# Patient Record
Sex: Male | Born: 1935 | Race: White | Hispanic: No | Smoking: Never smoker
Health system: Southern US, Community
[De-identification: ages and names within clinical notes are randomized; demographics above are authoritative.]

## PROBLEM LIST (undated history)

## (undated) DIAGNOSIS — M75102 Unspecified rotator cuff tear or rupture of left shoulder, not specified as traumatic: Secondary | ICD-10-CM

## (undated) DIAGNOSIS — K219 Gastro-esophageal reflux disease without esophagitis: Secondary | ICD-10-CM

## (undated) DIAGNOSIS — C439 Malignant melanoma of skin, unspecified: Secondary | ICD-10-CM

## (undated) DIAGNOSIS — F419 Anxiety disorder, unspecified: Secondary | ICD-10-CM

## (undated) DIAGNOSIS — C61 Malignant neoplasm of prostate: Secondary | ICD-10-CM

## (undated) DIAGNOSIS — G4700 Insomnia, unspecified: Secondary | ICD-10-CM

## (undated) DIAGNOSIS — F0390 Unspecified dementia without behavioral disturbance: Secondary | ICD-10-CM

## (undated) HISTORY — PX: KNEE SURGERY: SHX244

## (undated) HISTORY — PX: EYE SURGERY: SHX253

## (undated) HISTORY — PX: HEMORRHOID SURGERY: SHX153

## (undated) HISTORY — PX: PROSTATE SURGERY: SHX751

## (undated) HISTORY — PX: TONSILLECTOMY: SUR1361

---

## 2003-09-22 ENCOUNTER — Observation Stay (HOSPITAL_COMMUNITY): Admission: RE | Admit: 2003-09-22 | Discharge: 2003-09-24 | Payer: Self-pay | Admitting: General Surgery

## 2003-09-22 ENCOUNTER — Encounter (INDEPENDENT_AMBULATORY_CARE_PROVIDER_SITE_OTHER): Payer: Self-pay | Admitting: Specialist

## 2007-06-24 ENCOUNTER — Ambulatory Visit: Admission: RE | Admit: 2007-06-24 | Discharge: 2007-07-21 | Payer: Self-pay | Admitting: Radiation Oncology

## 2007-07-06 ENCOUNTER — Ambulatory Visit (HOSPITAL_BASED_OUTPATIENT_CLINIC_OR_DEPARTMENT_OTHER): Admission: RE | Admit: 2007-07-06 | Discharge: 2007-07-06 | Payer: Self-pay | Admitting: Urology

## 2007-11-19 ENCOUNTER — Ambulatory Visit (HOSPITAL_COMMUNITY): Admission: RE | Admit: 2007-11-19 | Discharge: 2007-11-19 | Payer: Self-pay | Admitting: Urology

## 2008-04-15 ENCOUNTER — Ambulatory Visit: Payer: Self-pay | Admitting: Internal Medicine

## 2008-04-15 DIAGNOSIS — C61 Malignant neoplasm of prostate: Secondary | ICD-10-CM | POA: Insufficient documentation

## 2008-04-15 DIAGNOSIS — E162 Hypoglycemia, unspecified: Secondary | ICD-10-CM | POA: Insufficient documentation

## 2008-04-15 DIAGNOSIS — F411 Generalized anxiety disorder: Secondary | ICD-10-CM | POA: Insufficient documentation

## 2008-04-15 DIAGNOSIS — R058 Other specified cough: Secondary | ICD-10-CM | POA: Insufficient documentation

## 2008-04-15 DIAGNOSIS — Z85820 Personal history of malignant melanoma of skin: Secondary | ICD-10-CM | POA: Insufficient documentation

## 2008-04-15 DIAGNOSIS — G4700 Insomnia, unspecified: Secondary | ICD-10-CM | POA: Insufficient documentation

## 2008-04-15 DIAGNOSIS — Z87898 Personal history of other specified conditions: Secondary | ICD-10-CM | POA: Insufficient documentation

## 2008-04-15 DIAGNOSIS — R079 Chest pain, unspecified: Secondary | ICD-10-CM | POA: Insufficient documentation

## 2008-04-15 DIAGNOSIS — I498 Other specified cardiac arrhythmias: Secondary | ICD-10-CM | POA: Insufficient documentation

## 2008-04-15 DIAGNOSIS — K219 Gastro-esophageal reflux disease without esophagitis: Secondary | ICD-10-CM | POA: Insufficient documentation

## 2008-04-15 DIAGNOSIS — R05 Cough: Secondary | ICD-10-CM | POA: Insufficient documentation

## 2010-09-18 NOTE — Op Note (Signed)
NAMEABOU, STERKEL            ACCOUNT NO.:  [id number]   MEDICAL RECORD NO.:  [id number]          PATIENT TYPE:  AMB   LOCATION:  [facility]                         FACILITY:  [facility]   PHYSICIAN:  Courtney Paris, M.D.DATE OF BIRTH:  [birth year]   DATE OF PROCEDURE:  07/06/2007  DATE OF DISCHARGE:                               OPERATIVE REPORT   PREOPERATIVE DIAGNOSIS:  Carcinoma of the prostate stage IIA, Gleason  3+3 with obstructive uropathy.   POSTOPERATIVE DIAGNOSIS:  Carcinoma of the prostate stage IIA, Gleason  3+3 with obstructive uropathy.   OPERATION:  TUNA.   ANESTHESIA:  General.   SURGEON:  Courtney Paris, M.D.   BRIEF HISTORY:  The 74 year old patient is admitted with clinical T2A  Gleason 3+3 adenocarcinoma of the prostate with elevated PSA. Biopsy  showed the cancer, but his symptom score was too high to undergo  radiation therapy at this time.  He went in retention in 2003 and he  cannot tolerate Avodart because of breast enlargement that required a  mammogram and he enters now for a TUNA procedure under anesthesia in  hopes that this would reduce his symptom score enough that he can  undergo definitive radiotherapy treatment to his prostate.  When he had  the original biopsy, his rectum was extremely tender and sore from  previous hemorrhoidectomy and it was thought that this was not possible  to be done under local anesthesia with a prostate block in the office so  general anesthesia was indicated.   The patient was placed on the operating table in dorsal lithotomy  position and after satisfactory induction of general anesthesia was  prepped and draped in the usual sterile fashion.  Given IV antibiotics.  Using the panendoscope, the prostate was inspected and measured about 65  grams. On preoperative films his length was about 5-1/2 cm and he had  trilobar hyperplasia.  The treatment was begun at the bladder neck and  with 14 mm needle extension at  6 o'clock this area was treated. The  right and left bladder neck were then treated with 20 mm needle  extension and then he had a total of 6 transverse treatments, each with  22 mm going back to a centimeter at a time and then the apex also at 22  mm for a total of 11 sticks.  The patient tolerated this well. A  #16  Foley catheter was inserted and the left to straight drainage.  The  irrigant was just light pink.  The patient tolerated this well and  returned home with detailed written instructions and will remove his  catheter in 2 days.      Courtney Paris, M.D.  Electronically Signed     HMK/MEDQ  D:  07/06/2007  T:  07/06/2007  Job:  [id number]

## 2010-09-21 NOTE — Op Note (Signed)
Jack Howard, Jack Howard                      ACCOUNT NO.:  [id number]   MEDICAL RECORD NO.:  [id number]                   PATIENT TYPE:  AMB   LOCATION:  DAY                                  [facility]ACILITY:  [facility]   PHYSICIAN:  Adolph Pollack, M.D.            DATE OF BIRTH:  10/24/1935   DATE OF PROCEDURE:  09/22/2003  DATE OF DISCHARGE:                                 OPERATIVE REPORT   PREOPERATIVE DIAGNOSES:  1. Hemorrhoids.  2. Anal rash.   POSTOPERATIVE DIAGNOSES:  1. Hemorrhoids.  2. Anal rash.   PROCEDURES:  1. Hemorrhoidectomy.  2. Biopsy of anal rash.   SURGEON:  Adolph Pollack, M.D.   ANESTHESIA:  General.   INDICATION:  Jack Howard is a 75 year old male, who has been bothered  by hemorrhoids intermittently for many years.  He has some bleeding after  bowel movements.  He has tried multiple treatments without success.  On  examination, indeed he has hemorrhoids in the right anterolateral and left  lateral position as well as a perianal rash.  He now presents for the above  procedures.  We discussed the biopsy of the perianal rash as well as the  hemorrhoidectomy preop.  We also discussed his BPH symptoms and staying  overnight potentially with a Foley catheter in.   TECHNIQUE:  He is seen in the holding area, then brought to the operating  room and placed supine on the operating table; general anesthetic was  administered.  He is placed in the lithotomy position, and the perianal area  was sterilely prepped and draped.  The hemorrhoidal complex in the right  anterolateral aspect was the first approach.  A mixture of Marcaine and  Wydase was injected subdermally and subcutaneously.  I then ligated the  hemorrhoidal vascular pedicle with a 3-0 chromic suture.  Using sharp  dissection, I excised the external and internal components of the  hemorrhoid.  Bleeding points were controlled by cautery.  I the approximated  the right knee mucosa and an anoderm  with a running locking 3-0 chromic  suture.  There was some bleeding along the suture line, and I reinforced  this with a single figure-of-eight 3-0 chromic suture for adequate  hemostasis.   Next, the left lateral hemorrhoidal complex was approached.  A mixture of  Marcaine and Wydase was injected into the subdermal and subcutaneous  tissues.  I then ligated a hemorrhoidal vascular pedicle with 3-0 chromic  suture and then excised the internal and external component sharply.  Care  was taken to preserve external sphincter muscle.  This was done also with  the right anterolateral hemorrhoidal complex.  Once the hemorrhoid was  removed, bleeding was controlled with the cautery and the anoderm and anal  mucosa of the skin were closed with running locking 3-0 chromic suture.  Hemostasis was adequate at the suture line.   Following this, I identified the rash and performed a punch  biopsy of part  of the rash and sent this to pathology separately.  Bleeding from this was  controlled with direct pressure.  I then placed some Gelfoam into the rectal  area.  Hemostasis was adequate.  A bulky dressing was applied.   Following this, I changed gloves and inserted a Foley catheter sterilely  without problems.   He tolerated the procedure well without any apparent complications.  He was  taken to the recovery room in satisfactory condition.                                               Adolph Pollack, M.D.    Kari Baars  D:  09/22/2003  T:  09/22/2003  Job:  [id number]   cc:   Meredith Staggers, M.D.  [address]  [place]  [place] [address]  Fax: [phone]

## 2011-01-06 ENCOUNTER — Encounter: Payer: Self-pay | Admitting: Family Medicine

## 2011-01-06 ENCOUNTER — Inpatient Hospital Stay (INDEPENDENT_AMBULATORY_CARE_PROVIDER_SITE_OTHER)
Admission: RE | Admit: 2011-01-06 | Discharge: 2011-01-06 | Disposition: A | Discharge status: Home / Self Care | Payer: Medicare Other | Source: Ambulatory Visit | Attending: Family Medicine | Admitting: Family Medicine

## 2011-01-06 DIAGNOSIS — H6690 Otitis media, unspecified, unspecified ear: Secondary | ICD-10-CM

## 2011-01-06 DIAGNOSIS — H9120 Sudden idiopathic hearing loss, unspecified ear: Secondary | ICD-10-CM | POA: Insufficient documentation

## 2011-01-28 LAB — DIFFERENTIAL
Eosinophils Absolute: 0
Eosinophils Relative: 0
Monocytes Absolute: 0.7
Monocytes Relative: 8
Neutro Abs: 5.8

## 2011-01-28 LAB — URINALYSIS, ROUTINE W REFLEX MICROSCOPIC
Ketones, ur: NEGATIVE
Leukocytes, UA: NEGATIVE
Nitrite: NEGATIVE
Urobilinogen, UA: 0.2

## 2011-01-28 LAB — CBC
MCV: 90.5
RBC: 4.22
WBC: 8

## 2011-01-28 LAB — BASIC METABOLIC PANEL
BUN: 21
Calcium: 9.1
Creatinine, Ser: 1.18
GFR calc non Af Amer: >60
Glucose, Bld: 110 — ABNORMAL HIGH
Sodium: 142

## 2011-04-08 NOTE — Progress Notes (Signed)
Summary: SUDDEN LOSS OF HEARING IN LEFT EAR (room 4)   Vital Signs:  Patient Profile:   75 Years Old Male CC:      sudden loss hearing left ear today Height:     68 inches Weight:      168 pounds O2 Sat:      97 % O2 treatment:    Room Air Temp:     98.6 degrees F oral Pulse rate:   62 / minute Resp:     16 per minute BP sitting:   120 / 71  (left arm) Cuff size:   regular  Pt. in pain?   no  Vitals Entered By: Lavell Islam RN (January 06, 2011 3:35 PM)                   Current Allergies: No known allergies History of Present Illness Chief Complaint: sudden loss hearing left ear today History of Present Illness:  Subjective:  Patient complains of awakening today and noticing a "whistling" noise in his left ear, followed by loss of hearing in the left ear.  No associated pain.  Denies nasal congestion, seasonal allergies, recent URI.  No neurologic symptoms.  No dizziness.  No drainage from ear.  No fevers, chills, and sweats.  Feels well otherwise.  REVIEW OF SYSTEMS Constitutional Symptoms      Denies fever, chills, night sweats, weight loss, weight gain, and fatigue.  Eyes       Denies change in vision, eye pain, eye discharge, glasses, contact lenses, and eye surgery. Ear/Nose/Throat/Mouth       Complains of change in hearing.      Denies hearing loss/aids, ear pain, ear discharge, dizziness, frequent runny nose, frequent nose bleeds, sinus problems, sore throat, hoarseness, and tooth pain or bleeding.      Comments: left ear Respiratory       Denies dry cough, productive cough, wheezing, shortness of breath, asthma, bronchitis, and emphysema/COPD.  Cardiovascular       Denies murmurs, chest pain, and tires easily with exhertion.    Gastrointestinal       Denies stomach pain, nausea/vomiting, diarrhea, constipation, blood in bowel movements, and indigestion. Genitourniary       Denies painful urination, kidney stones, and loss of urinary  control. Neurological       Denies paralysis, seizures, and fainting/blackouts. Musculoskeletal       Denies muscle pain, joint pain, joint stiffness, decreased range of motion, redness, swelling, muscle weakness, and gout.  Skin       Denies bruising, unusual mles/lumps or sores, and hair/skin or nail changes.  Psych       Denies mood changes, temper/anger issues, anxiety/stress, speech problems, depression, and sleep problems. Other Comments: sudden loss hearing left ear today   Past History:  Past Medical History: Reviewed history from 04/15/2008 and no changes required. PROSTATE CANCER (ICD-185) ANXIETY (ICD-300.00) BENIGN PROSTATIC HYPERTROPHY, HX OF (ICD-V13.8) MALIGNANT MELANOMA, HX OF (ICD-V10.82) INSOMNIA (ICD-780.52) CHEST PAIN, RECURRENT (ICD-786.50) BRADYCARDIA (ICD-427.89) ESOPHAGEAL REFLUX (ICD-530.81).............................................................Mar[facility] Kitchen[place]/[facility] HYPOGLYCEMIA (ICD-251.2) CHRONIC COUGH     - onset 10/09  Past Surgical History: Hemorrhoidectomy knee surgery eye surgery  Family History: Reviewed history from 04/15/2008 and no changes required. mother had breast cancer father had prostate cancer brother had leukemia no atopy or allergic dz  Social History: Reviewed history from 04/15/2008 and no changes required. retired Optician, dispensing x37 yrs lives with his wife Fleet Contras children never smoked no etoh   Objective:  Appearance:  Patient appears  healthy, stated age, and in no acute distress  Eyes:  Pupils are equal, round, and reactive to light and accomodation.  Extraocular movement is intact.  Conjunctivae are not inflamed.  Ears:  Canals normal.  Small amount cerumen left canal but TM visualized fully.  Tympanic membranes normal.   Nose:  Mildly congested turbinates.  No sinus tenderness  Mouth:  Tongue midline Pharynx:  Normal  Neck:  Supple.  No adenopathy is present.  Lungs:  Clear to auscultation.  Breath sounds are equal.   Heart:  Regular rate and rhythm without murmurs, rubs, or gallops.  Abdomen:  Nontender without masses or hepatosplenomegaly.  Bowel sounds are present.  No CVA or flank tenderness.  Neurologic:  Cranial nerves 2 through 12 are normal.  Patellar, achilles, and elbow reflexes are normal.  Cerebellar function is intact.  Gait and station are normal.  Patient can hear a tuning fork in his left ear but states that he hears it better in right ear.  Weber test lateralizes to the right ear.  Rinne test:  Air conduction better than bone conduction on left Tympanogram:  positive peak pressure on left, normal on right. Assessment New Problems: OTITIS MEDIA, ACUTE, LEFT (ICD-382.9) UNSPECIFIED SUDDEN HEARING LOSS (ICD-388.2)  SUSPECT OTITIS MEDIA  Plan New Medications/Changes: AMOXICILLIN 875 MG TABS (AMOXICILLIN) One by mouth two times a day  #20 x 0, 01/06/2011, Donna Christen MD  New Orders: Tympanometry [id number] New Patient Level IV [99204] Services provided After hours-Weekends-Holidays [99051] Planning Comments:   Begin amoxicillin, expectorant/decongestant,  topical decongestant, saline nasal irrigation.  Increase fluids. Followup with ENT in 5 to 7 days.   The patient and/or caregiver has been counseled thoroughly with regard to medications prescribed including dosage, schedule, interactions, rationale for use, and possible side effects and they verbalize understanding.  Diagnoses and expected course of recovery discussed and will return if not improved as expected or if the condition worsens. Patient and/or caregiver verbalized understanding.  Prescriptions: AMOXICILLIN 875 MG TABS (AMOXICILLIN) One by mouth two times a day  #20 x 0   Entered and Authorized by:   Donna Christen MD   Signed by:   Donna Christen MD on 01/06/2011   Method used:   Print then Give to Patient   RxID:   [id number]   Patient Instructions: 1)  Take Mucinex D (guaifenesin with decongestant) twice daily for  congestion. 2)  Increase fluid intake  3)  May use Afrin nasal spray (or generic oxymetazoline) twice daily for about 5 days.  Also recommend using saline nasal spray several times daily and/or saline nasal irrigation. 4)  Follow-up with ENT physician in 5 to 7 days (earlier if symptoms worsen)  Orders Added: 1)  Tympanometry [92567] 2)  New Patient Level IV [99204] 3)  Services provided After hours-Weekends-Holidays [99051]

## 2011-04-15 ENCOUNTER — Encounter: Payer: Self-pay | Admitting: *Deleted

## 2011-04-15 ENCOUNTER — Emergency Department
Admission: EM | Admit: 2011-04-15 | Discharge: 2011-04-15 | Disposition: A | Discharge status: Home / Self Care | Payer: Medicare Other | Source: Home / Self Care | Attending: Emergency Medicine | Admitting: Emergency Medicine

## 2011-04-15 ENCOUNTER — Emergency Department: Admit: 2011-04-15 | Discharge: 2011-04-15 | Disposition: A | Discharge status: Home / Self Care | Payer: Medicare Other

## 2011-04-15 DIAGNOSIS — M25519 Pain in unspecified shoulder: Secondary | ICD-10-CM

## 2011-04-15 DIAGNOSIS — M25529 Pain in unspecified elbow: Secondary | ICD-10-CM

## 2011-04-15 HISTORY — DX: Anxiety disorder, unspecified: F41.9

## 2011-04-15 HISTORY — DX: Malignant neoplasm of prostate: C61

## 2011-04-15 HISTORY — DX: Insomnia, unspecified: G47.00

## 2011-04-15 HISTORY — DX: Gastro-esophageal reflux disease without esophagitis: K21.9

## 2011-04-15 HISTORY — DX: Malignant melanoma of skin, unspecified: C43.9

## 2011-04-15 NOTE — ED Provider Notes (Signed)
History     CSN: [id number] Arrival date & time: 04/15/2011 10:20 AM   First MD Initiated Contact with Patient 04/15/11 1006      Chief Complaint  Patient presents with  . Shoulder Injury    (Consider location/radiation/quality/duration/timing/severity/associated sxs/prior treatment) HPI This patient presents today with left shoulder and elbow pain. He was getting Christmas decorations out the attic last week and fell off a ladder. He landed on his elbow. He had pain at the time and thought that it was going to go away, however the pain has continued. It is located in both his left elbow and his left shoulder. No previous history of any kind of trauma or fractures. He is right-handed. He has also noticed what feels to be a loose body in his elbow that he is able to move around with his other hand. He has been taking ibuprofen and using heat and ice which have all been helping. He states that since yesterday he feels that he is a little better but it is still lingering and would like it checked out.  Past Medical History  Diagnosis Date  . Prostate cancer   . Anxiety   . Malignant melanoma   . Insomnia   . GERD (gastroesophageal reflux disease)     Past Surgical History  Procedure Date  . Hemorrhoid surgery   . Knee surgery   . Eye surgery     Family History  Problem Relation Age of Onset  . Breast cancer Mother   . Prostate cancer Father   . Leukemia Brother     History  Substance Use Topics  . Smoking status: Never Smoker   . Smokeless tobacco: Not on file  . Alcohol Use: No      Review of Systems  Allergies  Review of patient's allergies indicates no known allergies.  Home Medications   Current Outpatient Rx  Name Route Sig Dispense Refill  . CITALOPRAM HYDROBROMIDE 20 MG PO TABS Oral Take 20 mg by mouth daily.      Marland Kitchen OMEPRAZOLE 20 MG PO CPDR Oral Take 20 mg by mouth daily.      . TRAMADOL HCL 50 MG PO TABS Oral Take 50 mg by mouth every 6 (six) hours as  needed. Maximum dose= 8 tablets per day       BP 111/62  Pulse 72  Temp(Src) 98.3 F (36.8 C) (Oral)  Resp 18  Ht 5\' 8"  (1.727 m)  Wt 168 lb 8 oz (76.431 kg)  BMI 25.62 kg/m2  SpO2 97%  Physical Exam  Nursing note and vitals reviewed. Constitutional: He is oriented to person, place, and time. He appears well-developed and well-nourished.  HENT:  Head: Normocephalic and atraumatic.  Neck: Neck supple.  Cardiovascular: Regular rhythm and normal heart sounds.   Pulmonary/Chest: Effort normal and breath sounds normal. No respiratory distress.  Musculoskeletal:       L Shoulder: Inspection reveals no abnormalities, atrophy or asymmetry.  Palpation demonstrates tenderness anterior and lateral shoulder. No tenderness over AC, [place].  Passive range of motion is normal, however active range of motion is limited to about 80 of abduction. Rotator cuff strength is intact, however is weaker on the left side. + Neer and Hawkin's tests, empty can.  Distal NV status intact.   L elbow: Range of motion is full with flexion, extension, supination, and pronation. There is tenderness distal to the olecranon with a small mobile mass that is hard. There is a mild amount of bruising  around the elbow as well the distal neurovascular status intact  Neurological: He is alert and oriented to person, place, and time.  Skin: Skin is warm and dry.  Psychiatric: He has a normal mood and affect. His speech is normal.    ED Course  Procedures (including critical care time)  Labs Reviewed - No data to display Dg Elbow Complete Left  04/15/2011  *RADIOLOGY REPORT*  Clinical Data: Larey Seat last week with left elbow pain  LEFT ELBOW - COMPLETE 3+ VIEW  Comparison: None.  Findings: The radial head appears intact.  No definite joint effusion is seen.  On only one oblique view there is slight cortical irregularity of the coronoid process of the proximal ulna which may represent a small nondisplaced fracture fragment.   IMPRESSION: Cannot exclude a small fracture fragment from the coronoid process of the proximal ulna.  No other abnormality.  Original Report Authenticated By: Juline Patch, M.D.   Dg Shoulder Left  04/15/2011  *RADIOLOGY REPORT*  Clinical Data: Larey Seat last week, left shoulder pain  LEFT SHOULDER - 2+ VIEW  Comparison: None.  Findings: The left humeral head is in normal position.  The left [facility] joint is normally aligned.  No acute bony abnormality is seen.  IMPRESSION: Negative.  Original Report Authenticated By: Juline Patch, M.D.     1. Elbow pain   2. Pain, joint, shoulder     Intra-articular injection of the left shoulder. Risks benefits and alternatives were discussed with the patient. Verbal consent was obtained. The patient was prepped in sterile condition and cleansed with iodine and alcohol. From a posterior approach, the left shoulder was injected with 40 mg of Depo-Medrol +2 cc of lidocaine without epinephrine. There is no immediate complications and the patient tolerated the procedure well.  MDM  An X-ray of his L shoulder was ordered and read by the radiologist as normal. An X-ray of his L elbow was ordered and read by the radiologist as above. Encourage rest, ice, compression with ACE bandage and/or a brace, and elevation of injured body part.  Due to his physical exam, I believe he has a rotator cuff tear in his left shoulder. I injected his shoulder. The procedure note is above. Also with his physical examination it is possible loose body, and the radiologist report of cannot exclude a small fragment in the coronoid process and the proximal ulna, I feel it is best to send this patient to an sports med / ortho doctor.  In the meantime and to place him in an arm sling, suggest over-the-counter ibuprofen, and ice. A few times a day would like him to get out of the sling and do very gentle range of motion to keep that elbow range of motion intact.  Lily Kocher, MD 04/15/11  1115

## 2011-04-15 NOTE — ED Notes (Signed)
Pt states that he fell off a ladder x 5 days ago, LT elbow to shoulder pain. He has taken IBF.

## 2011-04-18 ENCOUNTER — Other Ambulatory Visit: Payer: Self-pay | Admitting: Sports Medicine

## 2011-04-18 DIAGNOSIS — M25512 Pain in left shoulder: Secondary | ICD-10-CM

## 2011-04-22 ENCOUNTER — Ambulatory Visit
Admission: RE | Admit: 2011-04-22 | Discharge: 2011-04-22 | Disposition: A | Discharge status: Home / Self Care | Payer: Medicare Other | Source: Ambulatory Visit | Attending: Sports Medicine | Admitting: Sports Medicine

## 2011-04-22 DIAGNOSIS — M25512 Pain in left shoulder: Secondary | ICD-10-CM

## 2011-05-02 ENCOUNTER — Other Ambulatory Visit: Payer: Self-pay | Admitting: Sports Medicine

## 2011-05-02 ENCOUNTER — Ambulatory Visit
Admission: RE | Admit: 2011-05-02 | Discharge: 2011-05-02 | Disposition: A | Discharge status: Home / Self Care | Payer: Medicare Other | Source: Ambulatory Visit | Attending: Sports Medicine | Admitting: Sports Medicine

## 2011-05-02 DIAGNOSIS — R52 Pain, unspecified: Secondary | ICD-10-CM

## 2011-05-17 ENCOUNTER — Encounter (HOSPITAL_BASED_OUTPATIENT_CLINIC_OR_DEPARTMENT_OTHER): Payer: Self-pay | Admitting: *Deleted

## 2011-05-17 NOTE — Progress Notes (Signed)
No labs or ekg needed  

## 2011-05-21 ENCOUNTER — Ambulatory Visit (HOSPITAL_BASED_OUTPATIENT_CLINIC_OR_DEPARTMENT_OTHER)
Admission: RE | Admit: 2011-05-21 | Discharge: 2011-05-22 | Disposition: A | Discharge status: Home / Self Care | Payer: Medicare Other | Source: Ambulatory Visit | Attending: Orthopedic Surgery | Admitting: Orthopedic Surgery

## 2011-05-21 ENCOUNTER — Encounter (HOSPITAL_BASED_OUTPATIENT_CLINIC_OR_DEPARTMENT_OTHER): Payer: Self-pay | Admitting: Anesthesiology

## 2011-05-21 ENCOUNTER — Ambulatory Visit (HOSPITAL_BASED_OUTPATIENT_CLINIC_OR_DEPARTMENT_OTHER): Payer: Medicare Other | Admitting: Anesthesiology

## 2011-05-21 ENCOUNTER — Encounter (HOSPITAL_BASED_OUTPATIENT_CLINIC_OR_DEPARTMENT_OTHER)
Admission: RE | Disposition: A | Discharge status: Home / Self Care | Payer: Self-pay | Source: Ambulatory Visit | Attending: Orthopedic Surgery

## 2011-05-21 ENCOUNTER — Encounter (HOSPITAL_BASED_OUTPATIENT_CLINIC_OR_DEPARTMENT_OTHER): Payer: Self-pay | Admitting: *Deleted

## 2011-05-21 DIAGNOSIS — M25819 Other specified joint disorders, unspecified shoulder: Secondary | ICD-10-CM | POA: Insufficient documentation

## 2011-05-21 DIAGNOSIS — M67919 Unspecified disorder of synovium and tendon, unspecified shoulder: Secondary | ICD-10-CM | POA: Insufficient documentation

## 2011-05-21 DIAGNOSIS — K219 Gastro-esophageal reflux disease without esophagitis: Secondary | ICD-10-CM | POA: Insufficient documentation

## 2011-05-21 DIAGNOSIS — M719 Bursopathy, unspecified: Secondary | ICD-10-CM | POA: Insufficient documentation

## 2011-05-21 DIAGNOSIS — M75102 Unspecified rotator cuff tear or rupture of left shoulder, not specified as traumatic: Secondary | ICD-10-CM

## 2011-05-21 HISTORY — DX: Unspecified rotator cuff tear or rupture of left shoulder, not specified as traumatic: M75.102

## 2011-05-21 HISTORY — PX: ROTATOR CUFF REPAIR: SHX139

## 2011-05-21 LAB — POCT HEMOGLOBIN-HEMACUE: Hemoglobin: 12.1 g/dL — ABNORMAL LOW (ref 13.0–17.0)

## 2011-05-21 SURGERY — ARTHROSCOPY, SHOULDER, WITH ROTATOR CUFF REPAIR
Anesthesia: General | Site: Shoulder | Laterality: Left | Wound class: Clean

## 2011-05-21 MED ORDER — PROMETHAZINE HCL 25 MG PO TABS: 25.0000 mg | ORAL_TABLET | Freq: Four times a day (QID) | ORAL | Status: AC | PRN

## 2011-05-21 MED ORDER — FENTANYL CITRATE 0.05 MG/ML IJ SOLN
50.0000 ug | INTRAMUSCULAR | Status: DC | PRN
  Administered 2011-05-21: 100 ug via INTRAVENOUS

## 2011-05-21 MED ORDER — METHOCARBAMOL 500 MG PO TABS: 500.0000 mg | ORAL_TABLET | Freq: Four times a day (QID) | ORAL | Status: DC

## 2011-05-21 MED ORDER — DIPHENHYDRAMINE HCL 12.5 MG/5ML PO ELIX: 12.5000 mg | ORAL_SOLUTION | ORAL | Status: DC | PRN

## 2011-05-21 MED ORDER — MIDAZOLAM HCL 2 MG/2ML IJ SOLN
1.0000 mg | INTRAMUSCULAR | Status: DC | PRN
  Administered 2011-05-21: 2 mg via INTRAVENOUS

## 2011-05-21 MED ORDER — METOCLOPRAMIDE HCL 5 MG PO TABS: 5.0000 mg | ORAL_TABLET | Freq: Three times a day (TID) | ORAL | Status: DC | PRN

## 2011-05-21 MED ORDER — BUPIVACAINE-EPINEPHRINE PF 0.5-1:200000 % IJ SOLN
INTRAMUSCULAR | Status: DC | PRN
  Administered 2011-05-21: 30 mL

## 2011-05-21 MED ORDER — ACETAMINOPHEN 650 MG RE SUPP: 650.0000 mg | Freq: Four times a day (QID) | RECTAL | Status: DC | PRN

## 2011-05-21 MED ORDER — ONDANSETRON HCL 4 MG/2ML IJ SOLN: 4.0000 mg | Freq: Once | INTRAMUSCULAR | Status: DC | PRN

## 2011-05-21 MED ORDER — SODIUM CHLORIDE 0.45 % IV SOLN: INTRAVENOUS | Status: DC

## 2011-05-21 MED ORDER — PROPOFOL 10 MG/ML IV EMUL
INTRAVENOUS | Status: DC | PRN
  Administered 2011-05-21: 150 mg via INTRAVENOUS

## 2011-05-21 MED ORDER — CITALOPRAM HYDROBROMIDE 20 MG PO TABS
20.0000 mg | ORAL_TABLET | Freq: Every day | ORAL | Status: DC
  Filled 2011-05-21: qty 1

## 2011-05-21 MED ORDER — EPHEDRINE SULFATE 50 MG/ML IJ SOLN
INTRAMUSCULAR | Status: DC | PRN
  Administered 2011-05-21 (×3): 10 mg via INTRAVENOUS

## 2011-05-21 MED ORDER — PANTOPRAZOLE SODIUM 40 MG PO TBEC
40.0000 mg | DELAYED_RELEASE_TABLET | Freq: Every day | ORAL | Status: DC
Start: 2011-05-21 — End: 2011-05-22
  Filled 2011-05-21: qty 1

## 2011-05-21 MED ORDER — HYDROMORPHONE HCL PF 1 MG/ML IJ SOLN: 0.2500 mg | INTRAMUSCULAR | Status: DC | PRN

## 2011-05-21 MED ORDER — CEFAZOLIN SODIUM 1-5 GM-% IV SOLN
INTRAVENOUS | Status: DC | PRN
  Administered 2011-05-21: 2 g via INTRAVENOUS

## 2011-05-21 MED ORDER — PHENOL 1.4 % MT LIQD
1.0000 | OROMUCOSAL | Status: DC | PRN
  Administered 2011-05-21: 1 via OROMUCOSAL

## 2011-05-21 MED ORDER — MEPERIDINE HCL 25 MG/ML IJ SOLN: 6.2500 mg | INTRAMUSCULAR | Status: DC | PRN

## 2011-05-21 MED ORDER — ROCURONIUM BROMIDE 100 MG/10ML IV SOLN
INTRAVENOUS | Status: DC | PRN
  Administered 2011-05-21: 50 mg via INTRAVENOUS

## 2011-05-21 MED ORDER — ACETAMINOPHEN 325 MG PO TABS: 650.0000 mg | ORAL_TABLET | Freq: Four times a day (QID) | ORAL | Status: DC | PRN

## 2011-05-21 MED ORDER — OXYCODONE HCL 5 MG PO TABS: 5.0000 mg | ORAL_TABLET | ORAL | Status: DC | PRN

## 2011-05-21 MED ORDER — METOCLOPRAMIDE HCL 5 MG/ML IJ SOLN: 5.0000 mg | Freq: Three times a day (TID) | INTRAMUSCULAR | Status: DC | PRN

## 2011-05-21 MED ORDER — SODIUM CHLORIDE 0.9 % IR SOLN
Status: DC | PRN
  Administered 2011-05-21: 3000 mL

## 2011-05-21 MED ORDER — OXYCODONE-ACETAMINOPHEN 10-325 MG PO TABS: 1.0000 | ORAL_TABLET | Freq: Four times a day (QID) | ORAL | Status: DC | PRN

## 2011-05-21 MED ORDER — DEXAMETHASONE SODIUM PHOSPHATE 4 MG/ML IJ SOLN
INTRAMUSCULAR | Status: DC | PRN
  Administered 2011-05-21: 10 mg via INTRAVENOUS

## 2011-05-21 MED ORDER — METHOCARBAMOL 100 MG/ML IJ SOLN: 500.0000 mg | Freq: Four times a day (QID) | INTRAMUSCULAR | Status: DC | PRN

## 2011-05-21 MED ORDER — HYDROMORPHONE HCL PF 1 MG/ML IJ SOLN: 0.5000 mg | INTRAMUSCULAR | Status: DC | PRN

## 2011-05-21 MED ORDER — LIDOCAINE HCL (CARDIAC) 20 MG/ML IV SOLN
INTRAVENOUS | Status: DC | PRN
  Administered 2011-05-21: 100 mg via INTRAVENOUS

## 2011-05-21 MED ORDER — FENTANYL CITRATE 0.05 MG/ML IJ SOLN
INTRAMUSCULAR | Status: DC | PRN
  Administered 2011-05-21: 100 ug via INTRAVENOUS

## 2011-05-21 MED ORDER — ONDANSETRON HCL 4 MG PO TABS
4.0000 mg | ORAL_TABLET | Freq: Four times a day (QID) | ORAL | Status: DC | PRN
  Administered 2011-05-21: 4 mg via ORAL

## 2011-05-21 MED ORDER — CEFAZOLIN SODIUM 1-5 GM-% IV SOLN
1.0000 g | Freq: Four times a day (QID) | INTRAVENOUS | Status: AC
  Administered 2011-05-21 (×2): 1 g via INTRAVENOUS

## 2011-05-21 MED ORDER — MENTHOL 3 MG MT LOZG: 1.0000 | LOZENGE | OROMUCOSAL | Status: DC | PRN

## 2011-05-21 MED ORDER — NEOSTIGMINE METHYLSULFATE 1 MG/ML IJ SOLN
INTRAMUSCULAR | Status: DC | PRN
  Administered 2011-05-21: 2 mg via INTRAVENOUS

## 2011-05-21 MED ORDER — ONDANSETRON HCL 4 MG/2ML IJ SOLN
INTRAMUSCULAR | Status: DC | PRN
  Administered 2011-05-21: 4 mg via INTRAVENOUS

## 2011-05-21 MED ORDER — LACTATED RINGERS IV SOLN
INTRAVENOUS | Status: DC
  Administered 2011-05-21 (×3): via INTRAVENOUS

## 2011-05-21 MED ORDER — ZOLPIDEM TARTRATE 5 MG PO TABS
5.0000 mg | ORAL_TABLET | Freq: Every evening | ORAL | Status: DC | PRN
  Administered 2011-05-21: 5 mg via ORAL

## 2011-05-21 MED ORDER — SODIUM CHLORIDE 0.9 % IV SOLN
INTRAVENOUS | Status: DC
  Administered 2011-05-21: 15:00:00 via INTRAVENOUS

## 2011-05-21 MED ORDER — GLYCOPYRROLATE 0.2 MG/ML IJ SOLN
INTRAMUSCULAR | Status: DC | PRN
  Administered 2011-05-21: 0.3 mg via INTRAVENOUS

## 2011-05-21 MED ORDER — OXYCODONE-ACETAMINOPHEN 5-325 MG PO TABS
1.0000 | ORAL_TABLET | ORAL | Status: DC | PRN
  Administered 2011-05-21: 1 via ORAL

## 2011-05-21 MED ORDER — METHOCARBAMOL 500 MG PO TABS
500.0000 mg | ORAL_TABLET | Freq: Four times a day (QID) | ORAL | Status: DC | PRN
  Administered 2011-05-22: 500 mg via ORAL

## 2011-05-21 MED ORDER — ONDANSETRON HCL 4 MG/2ML IJ SOLN: 4.0000 mg | Freq: Four times a day (QID) | INTRAMUSCULAR | Status: DC | PRN

## 2011-05-21 SURGICAL SUPPLY — 70 items
ANCH SUT SWLK 19.1X4.75 (Anchor) ×2 IMPLANT
ANCHOR SUT BIO SW 4.75X19.1 (Anchor) ×2 IMPLANT
APL SKNCLS STERI-STRIP NONHPOA (GAUZE/BANDAGES/DRESSINGS) ×1
BENZOIN TINCTURE PRP APPL 2/3 (GAUZE/BANDAGES/DRESSINGS) ×2 IMPLANT
BLADE 4.2CUDA (BLADE) IMPLANT
BLADE CUDA GRT WHITE 3.5 (BLADE) IMPLANT
BLADE CUTTER GATOR 3.5 (BLADE) ×2 IMPLANT
BLADE GREAT WHITE 4.2 (BLADE) IMPLANT
BLADE SURG 15 STRL LF DISP TIS (BLADE) IMPLANT
BLADE SURG 15 STRL SS (BLADE)
BUR OVAL 6.0 (BURR) IMPLANT
CANISTER OMNI JUG 16 LITER (MISCELLANEOUS) ×2 IMPLANT
CANNULA 5.75X71 LONG (CANNULA) ×2 IMPLANT
CANNULA ACUFLEX KIT 5X76 (CANNULA) IMPLANT
CANNULA TWIST IN 8.25X7CM (CANNULA) ×1 IMPLANT
CANNULA TWIST IN 8.25X9CM (CANNULA) IMPLANT
CLOSURE STERI STRIP 1/2 X4 (GAUZE/BANDAGES/DRESSINGS) ×1 IMPLANT
CLOTH BEACON ORANGE TIMEOUT ST (SAFETY) ×2 IMPLANT
CUTTER MENISCUS  4.2MM (BLADE)
CUTTER MENISCUS 4.2MM (BLADE) IMPLANT
DECANTER SPIKE VIAL GLASS SM (MISCELLANEOUS) IMPLANT
DRAPE INCISE IOBAN 66X45 STRL (DRAPES) ×2 IMPLANT
DRAPE SHOULDER BEACH CHAIR (DRAPES) ×2 IMPLANT
DRAPE U 20/CS (DRAPES) ×2 IMPLANT
DRAPE U-SHAPE 47X51 STRL (DRAPES) ×2 IMPLANT
DRSG PAD ABDOMINAL 8X10 ST (GAUZE/BANDAGES/DRESSINGS) ×2 IMPLANT
DURAPREP 26ML APPLICATOR (WOUND CARE) ×2 IMPLANT
ELECT REM PT RETURN 9FT ADLT (ELECTROSURGICAL) ×2
ELECTRODE REM PT RTRN 9FT ADLT (ELECTROSURGICAL) ×1 IMPLANT
FIBERSTICK 2 (SUTURE) IMPLANT
GLOVE BIO SURGEON STRL SZ 6.5 (GLOVE) ×1 IMPLANT
GLOVE BIO SURGEON STRL SZ8 (GLOVE) ×2 IMPLANT
GLOVE BIOGEL PI IND STRL 7.0 (GLOVE) IMPLANT
GLOVE BIOGEL PI IND STRL 8 (GLOVE) ×2 IMPLANT
GLOVE BIOGEL PI INDICATOR 7.0 (GLOVE) ×1
GLOVE BIOGEL PI INDICATOR 8 (GLOVE) ×2
GLOVE ORTHO TXT STRL SZ7.5 (GLOVE) ×2 IMPLANT
GOWN PREVENTION PLUS XLARGE (GOWN DISPOSABLE) ×1 IMPLANT
GOWN PREVENTION PLUS XXLARGE (GOWN DISPOSABLE) ×3 IMPLANT
IMMOBILIZER SHOULDER XLGE (ORTHOPEDIC SUPPLIES) IMPLANT
IV NS IRRIG 3000ML ARTHROMATIC (IV SOLUTION) ×5 IMPLANT
KIT SHOULDER TRACTION (DRAPES) ×2 IMPLANT
PACK ARTHROSCOPY DSU (CUSTOM PROCEDURE TRAY) ×2 IMPLANT
PACK BASIN DAY SURGERY FS (CUSTOM PROCEDURE TRAY) ×2 IMPLANT
SET ARTHROSCOPY TUBING (MISCELLANEOUS) ×2
SET ARTHROSCOPY TUBING LN (MISCELLANEOUS) ×1 IMPLANT
SHEET MEDIUM DRAPE 40X70 STRL (DRAPES) ×2 IMPLANT
SLEEVE SCD COMPRESS KNEE MED (MISCELLANEOUS) ×2 IMPLANT
SLING ARM FOAM STRAP LRG (SOFTGOODS) IMPLANT
SLING ARM FOAM STRAP MED (SOFTGOODS) IMPLANT
SLING ARM FOAM STRAP XLG (SOFTGOODS) IMPLANT
SLING ARM IMMOBILIZER LRG (SOFTGOODS) ×1 IMPLANT
SLING ARM IMMOBILIZER MED (SOFTGOODS) IMPLANT
SPONGE GAUZE 4X4 12PLY (GAUZE/BANDAGES/DRESSINGS) ×2 IMPLANT
SPONGE GAUZE 4X4 16PLY NONSTR (GAUZE/BANDAGES/DRESSINGS) ×1 IMPLANT
STRIP CLOSURE SKIN 1/2X4 (GAUZE/BANDAGES/DRESSINGS) ×2 IMPLANT
SUT FIBERWIRE #2 38 T-5 BLUE (SUTURE)
SUT MNCRL AB 4-0 PS2 18 (SUTURE) ×1 IMPLANT
SUT PDS AB 1 CT  36 (SUTURE)
SUT PDS AB 1 CT 36 (SUTURE) IMPLANT
SUT TIGER TAPE 7 IN WHITE (SUTURE) ×2 IMPLANT
SUT VIC AB 3-0 SH 27 (SUTURE)
SUT VIC AB 3-0 SH 27X BRD (SUTURE) IMPLANT
SUTURE FIBERWR #2 38 T-5 BLUE (SUTURE) IMPLANT
TAPE CLOTH SURG 6X10 WHT LF (GAUZE/BANDAGES/DRESSINGS) ×1 IMPLANT
TAPE FIBER 2MM 7IN #2 BLUE (SUTURE) ×1 IMPLANT
TOWEL OR 17X24 6PK STRL BLUE (TOWEL DISPOSABLE) ×2 IMPLANT
TUBE CONNECTING 20X1/4 (TUBING) ×2 IMPLANT
WAND STAR VAC 90 (SURGICAL WAND) ×2 IMPLANT
WATER STERILE IRR 1000ML POUR (IV SOLUTION) ×2 IMPLANT

## 2011-05-21 NOTE — Anesthesia Postprocedure Evaluation (Signed)
  Anesthesia Post-op Note  Patient: Jack Howard  Procedure(s) Performed:  SHOULDER ARTHROSCOPY WITH ROTATOR CUFF REPAIR - left shoulder arthroscopy , debridement limited, subacromial decompression partial acromioplasty with coracoacromial release, rotator cuff repair  Patient Location: PACU  Anesthesia Type: General  Level of Consciousness: awake  Airway and Oxygen Therapy: Patient connected to nasal cannula oxygen  Post-op Pain: none  Post-op Assessment: Post-op Vital signs reviewed  Post-op Vital Signs: stable  Complications: No apparent anesthesia complications

## 2011-05-21 NOTE — Progress Notes (Signed)
Assisted Dr. Ossey with left, ultrasound guided, supraclavicular block. Side rails up, monitors on throughout procedure. See vital signs in flow sheet. Tolerated Procedure well. 

## 2011-05-21 NOTE — H&P (Signed)
  PREOPERATIVE H&P  Chief Complaint: impingement, left shoulder rotator cuff tear  HPI: Jack Howard is a 76 y.o. male who presents for preoperative history and physical with a diagnosis of impingement, with atraumatic left shoulder rotator cuff tear. Symptoms are rated as moderate to severe, and have been worsening.  This is significantly impairing activities of daily living.  He has elected for surgical management. He has not been able to lift his arm.  Past Medical History  Diagnosis Date  . Prostate cancer   . Anxiety   . Malignant melanoma   . Insomnia   . GERD (gastroesophageal reflux disease)    Past Surgical History  Procedure Date  . Hemorrhoid surgery   . Knee surgery   . Tonsillectomy   . Eye surgery     both cataracts  . Prostate surgery    History   Social History  . Marital Status: Married    Spouse Name: N/A    Number of Children: N/A  . Years of Education: N/A   Social History Main Topics  . Smoking status: Never Smoker   . Smokeless tobacco: None  . Alcohol Use: No  . Drug Use: No  . Sexually Active:    Other Topics Concern  . None   Social History Narrative  . None   Family History  Problem Relation Age of Onset  . Breast cancer Mother   . Prostate cancer Father   . Leukemia Brother    No Known Allergies Prior to Admission medications   Medication Sig Start Date End Date Taking? Authorizing Provider  citalopram (CELEXA) 20 MG tablet Take 20 mg by mouth daily.     Yes Historical Provider, MD  HYDROcodone-acetaminophen (NORCO) 5-325 MG per tablet Take 1 tablet by mouth every 6 (six) hours as needed.   Yes Historical Provider, MD  omeprazole (PRILOSEC) 20 MG capsule Take 20 mg by mouth daily.     Yes Historical Provider, MD  traMADol (ULTRAM) 50 MG tablet Take 50 mg by mouth every 6 (six) hours as needed. Maximum dose= 8 tablets per day    Yes Historical Provider, MD     Positive ROS: All other systems have been reviewed and were  otherwise negative with the exception of those mentioned in the HPI and as above.  Physical Exam: General: Alert, no acute distress Cardiovascular: No pedal edema Respiratory: No cyanosis, no use of accessory musculature GI: No organomegaly, abdomen is soft and non-tender Skin: No lesions in the area of chief complaint Neurologic: Sensation intact distally Psychiatric: Patient is competent for consent with normal mood and affect Lymphatic: No axillary or cervical lymphadenopathy  MUSCULOSKELETAL: Left arm has substantial weakness with drop arm testing, and active forward flexion is 0-60 at best.  Assessment: Left shoulder impingement syndrome, rotator cuff tear, massive  Plan: Plan for Procedure(s): SHOULDER ARTHROSCOPY WITH ROTATOR CUFF REPAIR, probable acromioplasty, with probable debridement.  The risks benefits and alternatives were discussed with the patient including but not limited to the risks of nonoperative treatment, versus surgical intervention including infection, bleeding, nerve injury,  blood clots, cardiopulmonary complications, morbidity, mortality, among others, and they were willing to proceed. We've also discussed the risk of repeat rupture, failure to regain full functional activities, among others.  Duff Pozzi P 05/21/2011 9:58 AM

## 2011-05-21 NOTE — Anesthesia Preprocedure Evaluation (Signed)
Anesthesia Evaluation  Patient identified by MRN, date of birth, ID band Patient awake    Reviewed: Allergy & Precautions, H&P , NPO status , Patient's Chart, lab work & pertinent test results  Airway Mallampati: II TM Distance: >3 FB Neck ROM: full    Dental   Pulmonary          Cardiovascular     Neuro/Psych    GI/Hepatic   Endo/Other    Renal/GU      Musculoskeletal   Abdominal   Peds  Hematology   Anesthesia Other Findings   Reproductive/Obstetrics                           Anesthesia Physical Anesthesia Plan  ASA: II  Anesthesia Plan: General ETT and Regional   Post-op Pain Management:    Induction:   Airway Management Planned:   Additional Equipment:   Intra-op Plan:   Post-operative Plan:   Informed Consent: I have reviewed the patients History and Physical, chart, labs and discussed the procedure including the risks, benefits and alternatives for the proposed anesthesia with the patient or authorized representative who has indicated his/her understanding and acceptance.     Plan Discussed with: CRNA and Surgeon  Anesthesia Plan Comments:         Anesthesia Quick Evaluation

## 2011-05-21 NOTE — Transfer of Care (Signed)
Immediate Anesthesia Transfer of Care Note  Patient: Jack Howard  Procedure(s) Performed:  SHOULDER ARTHROSCOPY WITH ROTATOR CUFF REPAIR - left shoulder arthroscopy , debridement limited, subacromial decompression partial acromioplasty with coracoacromial release, rotator cuff repair  Patient Location: PACU  Anesthesia Type: General  Level of Consciousness: awake and alert   Airway & Oxygen Therapy: Patient Spontanous Breathing and Patient connected to face mask oxygen  Post-op Assessment: Report given to PACU RN and Post -op Vital signs reviewed and stable  Post vital signs: Reviewed and stable Filed Vitals:   05/21/11 0940  BP:   Pulse: 57  Temp:   Resp:     Complications: No apparent anesthesia complications

## 2011-05-21 NOTE — Anesthesia Procedure Notes (Addendum)
Anesthesia Regional Block:  Interscalene brachial plexus block  Pre-Anesthetic Checklist: ,, timeout performed, Correct Patient, Correct Site, Correct Laterality, Correct Procedure, Correct Position, site marked, Risks and benefits discussed,  Surgical consent,  Pre-op evaluation,  At surgeon's request and post-op pain management  Laterality: Left  Prep: chloraprep       Needles:   Needle Type: Echogenic Stimulator Needle     Needle Length: 5cm 5 cm     Additional Needles:  Procedures: ultrasound guided and nerve stimulator Interscalene brachial plexus block  Nerve Stimulator or Paresthesia:  Response: 0.4 mA,   Additional Responses:   Narrative:  Start time: 05/21/2011 9:15 AM End time: 05/21/2011 9:30 AM Injection made incrementally with aspirations every 5 mL. Anesthesiologist: Arta Bruce MD  Additional Notes: Monitors applied. Patient sedated. Sterile prep and drape,hand hygiene and sterile gloves were used. Relevant anatomy identified.Needle position confirmed.Local anesthetic injected incrementally after negative aspiration. Local anesthetic spread visualized around nerve(s). Vascular puncture avoided. No complications. Image printed for medical record.The patient tolerated the procedure well.        Procedure Name: Intubation Date/Time: 05/21/2011 10:30 AM Performed by: Signa Kell Pre-anesthesia Checklist: Patient identified, Emergency Drugs available, Suction available and Patient being monitored Patient Re-evaluated:Patient Re-evaluated prior to inductionOxygen Delivery Method: Circle System Utilized Preoxygenation: Pre-oxygenation with 100% oxygen Intubation Type: IV induction Ventilation: Mask ventilation without difficulty Laryngoscope Size: Mac and 3 Tube type: Oral Tube size: 8.0 mm Number of attempts: 1 Airway Equipment and Method: stylet and oral airway Placement Confirmation: ETT inserted through vocal cords under direct vision,  positive ETCO2  and breath sounds checked- equal and bilateral Secured at: 21 cm Tube secured with: Tape Dental Injury: Teeth and Oropharynx as per pre-operative assessment

## 2011-05-21 NOTE — Progress Notes (Signed)
Dr Michelle Piper and Dr Dion Saucier spoke and pt. To stay the night in recovery center in regards to sleep study apnea.  Pt verbalized understanding and Pts wife, Fleet Contras updated on the plan of care at this time.  Dr Dion Saucier to place overnight orders for pt. At this time.

## 2011-05-21 NOTE — Op Note (Signed)
05/21/2011  12:11 PM  PATIENT:  Jack Howard    PRE-OPERATIVE DIAGNOSIS:  impingement, rotator cuff rupture left shoulder, labral fraying  POST-OPERATIVE DIAGNOSIS:  Same  PROCEDURE:  SHOULDER ARTHROSCOPY WITH ROTATOR CUFF REPAIR, debridement of labrum and acromioplasty  SURGEON:  Maricia Scotti P  PHYSICIAN ASSISTANT: Janace Litten, OPA-C, present and scrubbed throughout the case, critical for completion in a timely fashion, and for retraction, instrumentation, and closure.  ANESTHESIA:   General  PREOPERATIVE INDICATIONS:  [name] is a  76 y.o. male with a diagnosis of impingement, rotator cuff rupture left shoulder who failed conservative measures and elected for surgical management.    The risks benefits and alternatives were discussed with the patient preoperatively including but not limited to the risks of infection, bleeding, nerve injury, cardiopulmonary complications, the need for revision surgery, among others, and the patient was willing to proceed. We'll see discussed the risks of recurrent rotator cuff tear, pain, limited function, the need for revision surgery, among others.  OPERATIVE IMPLANTS: Arthrex Swivelock 4.7 mm x2 with inverted fiber tape x2  OPERATIVE FINDINGS: The rotator cuff was completely torn, involving the entirety of the supraspinatus. The biceps pulley was intact. The subscapularis had some fraying, but was still attached to the humeral head. The infraspinatus was almost entirely intact. The biceps tendon was normal. The superior labrum had some fraying, as well as anterior labral fraying and a little bit posterior superiorly. The supraspinatus was completely torn, but had minimal retraction, and tissue quality was reasonable. The bone quality itself was fairly poor.  OPERATIVE PROCEDURE: The patient is brought to the operating room placed in supine position. General anesthesia was administered. Regional block targeting given. She was placed in  a semilateral decubitus position. All bony prominences were padded. The left upper [place] was prepped and draped in usual sterile fashion. Diagnostic arthroscopy was carried out the above-named findings. His shoulder did have full motion prior to incision.  The scopic shaver was used to debride the subscapularis fraying, as well as the superior labral fraying and posterior superior labral fraying. I also debrided the undersurface of the rotator cuff which was torn.  I placed the arthroscopic instruments to the subacromial space. A bursectomy was performed, and the CA ligament had significant fraying. This was released. I placed a lateral cannula, and then used the arthroscopic burr to prepare the tuberosity gently. I then placed 2 inverted fiber tape sutures, and then placed 4.75 mm swivel locks. These were placed just at the lateral margin in order to optimize tissue opposition to the tuberosity.  Excellent soft tissue tension was achieved, and the bone was reattached to the tendon.  I then performed a light acromioplasty, and then irrigated the wounds copiously and removed the arthroscopic instruments. The portals were closed with Monocryl. The patient was awakened and returned to the PACU in stable and satisfactory condition. There were no complications and he tolerated the procedure well.`

## 2011-05-30 ENCOUNTER — Emergency Department (HOSPITAL_COMMUNITY): Payer: Medicare Other

## 2011-05-30 ENCOUNTER — Encounter (HOSPITAL_COMMUNITY): Payer: Self-pay | Admitting: Emergency Medicine

## 2011-05-30 ENCOUNTER — Telehealth: Payer: Self-pay | Admitting: Emergency Medicine

## 2011-05-30 ENCOUNTER — Emergency Department: Admission: EM | Admit: 2011-05-30 | Discharge: 2011-05-30 | Payer: Medicare Other | Source: Home / Self Care

## 2011-05-30 ENCOUNTER — Other Ambulatory Visit: Payer: Self-pay

## 2011-05-30 ENCOUNTER — Emergency Department (HOSPITAL_COMMUNITY)
Admission: EM | Admit: 2011-05-30 | Discharge: 2011-05-30 | Disposition: A | Discharge status: Home / Self Care | Payer: Medicare Other | Attending: Emergency Medicine | Admitting: Emergency Medicine

## 2011-05-30 ENCOUNTER — Encounter: Payer: Self-pay | Admitting: *Deleted

## 2011-05-30 DIAGNOSIS — R059 Cough, unspecified: Secondary | ICD-10-CM | POA: Insufficient documentation

## 2011-05-30 DIAGNOSIS — R079 Chest pain, unspecified: Secondary | ICD-10-CM | POA: Insufficient documentation

## 2011-05-30 DIAGNOSIS — R0602 Shortness of breath: Secondary | ICD-10-CM | POA: Insufficient documentation

## 2011-05-30 DIAGNOSIS — R12 Heartburn: Secondary | ICD-10-CM

## 2011-05-30 DIAGNOSIS — G8918 Other acute postprocedural pain: Secondary | ICD-10-CM

## 2011-05-30 DIAGNOSIS — R05 Cough: Secondary | ICD-10-CM | POA: Insufficient documentation

## 2011-05-30 LAB — POCT I-STAT, CHEM 8
BUN: 38 mg/dL — ABNORMAL HIGH (ref 6–23)
Calcium, Ion: 1.11 mmol/L — ABNORMAL LOW (ref 1.12–1.32)
Creatinine, Ser: 1.3 mg/dL (ref 0.50–1.35)
Glucose, Bld: 81 mg/dL (ref 70–99)
TCO2: 22 mmol/L (ref 0–100)

## 2011-05-30 LAB — CBC
Hemoglobin: 13.1 g/dL (ref 13.0–17.0)
MCH: 30.5 pg (ref 26.0–34.0)
MCHC: 33.7 g/dL (ref 30.0–36.0)
MCV: 90.5 fL (ref 78.0–100.0)
RBC: 4.3 MIL/uL (ref 4.22–5.81)

## 2011-05-30 LAB — DIFFERENTIAL
Eosinophils Absolute: 0 10*3/uL (ref 0.0–0.7)
Eosinophils Relative: 0 % (ref 0–5)
Lymphs Abs: 1.9 10*3/uL (ref 0.7–4.0)
Monocytes Relative: 9 % (ref 3–12)

## 2011-05-30 MED ORDER — GI COCKTAIL ~~LOC~~: 30.0000 mL | Freq: Two times a day (BID) | ORAL | Status: DC | PRN

## 2011-05-30 MED ORDER — MORPHINE SULFATE 4 MG/ML IJ SOLN
4.0000 mg | Freq: Once | INTRAMUSCULAR | Status: AC
  Administered 2011-05-30: 4 mg via INTRAVENOUS
  Filled 2011-05-30: qty 1

## 2011-05-30 MED ORDER — ASPIRIN 325 MG PO TABS
325.0000 mg | ORAL_TABLET | Freq: Once | ORAL | Status: AC
  Administered 2011-05-30: 325 mg via ORAL

## 2011-05-30 MED ORDER — FENTANYL CITRATE 0.05 MG/ML IJ SOLN
50.0000 ug | Freq: Once | INTRAMUSCULAR | Status: AC
  Administered 2011-05-30: 50 ug via INTRAVENOUS
  Filled 2011-05-30: qty 2

## 2011-05-30 MED ORDER — NITROGLYCERIN 0.4 MG SL SUBL
0.4000 mg | SUBLINGUAL_TABLET | Freq: Once | SUBLINGUAL | Status: AC
  Administered 2011-05-30: 0.4 mg via SUBLINGUAL

## 2011-05-30 MED ORDER — IOHEXOL 300 MG/ML  SOLN
68.0000 mL | Freq: Once | INTRAMUSCULAR | Status: AC | PRN
  Administered 2011-05-30: 68 mL via INTRAVENOUS

## 2011-05-30 MED ORDER — GI COCKTAIL ~~LOC~~
30.0000 mL | Freq: Once | ORAL | Status: AC
  Administered 2011-05-30: 30 mL via ORAL
  Filled 2011-05-30: qty 30

## 2011-05-30 NOTE — ED Notes (Signed)
Pt had a left rotator cuff surgery that was performed here on the 15th of this month.

## 2011-05-30 NOTE — ED Notes (Signed)
Pt coming from urgent care where he initially he was coming from home for what he is stating is his esophagus.  Pain is substernal and pt describing as burning.  Pt was given 324 asa and one nitro sl at urgent that did provide relief.

## 2011-05-30 NOTE — ED Provider Notes (Signed)
History     CSN: [id number]  Arrival date & time 05/30/11  1245   First MD Initiated Contact with Patient 05/30/11 1302      1:29 PM HPI Patient reports a left rotator cuff repair performed on 05/21/2011 done by Dr. Dion Saucier. Reports since surgery has had persistently worsening shortness of breath. Reports shortness of breath was worse with exertion and associated with cough. Denies fever, chest pain, back Patient is a 76 y.o. male presenting with shortness of breath. The history is provided by the patient.  Shortness of Breath  The current episode started more than 1 week ago. The onset was gradual. The problem occurs continuously. The problem has been gradually worsening. The problem is moderate. The symptoms are relieved by nothing. The symptoms are aggravated by activity. Associated symptoms include cough and shortness of breath. Pertinent negatives include no chest pain, no fever and no sore throat. His past medical history does not include asthma or past wheezing. Recently, medical care has been given by a specialist (Rotator cuff repair).    Past Medical History  Diagnosis Date  . Prostate cancer   . Anxiety   . Malignant melanoma   . Insomnia   . GERD (gastroesophageal reflux disease)   . Left rotator cuff tear 05/21/2011    Past Surgical History  Procedure Date  . Hemorrhoid surgery   . Knee surgery   . Tonsillectomy   . Eye surgery     both cataracts  . Prostate surgery   . Rotator cuff repair 05/21/2011    Family History  Problem Relation Age of Onset  . Breast cancer Mother   . Prostate cancer Father   . Leukemia Brother     History  Substance Use Topics  . Smoking status: Never Smoker   . Smokeless tobacco: Not on file  . Alcohol Use: No      Review of Systems  Constitutional: Negative for fever, chills, diaphoresis and fatigue.  HENT: Negative for sore throat.   Respiratory: Positive for cough and shortness of breath.   Cardiovascular: Negative for  chest pain and leg swelling.  Gastrointestinal: Negative for nausea, vomiting, abdominal pain, diarrhea, constipation, blood in stool and rectal pain.  Genitourinary: Negative for dysuria, hematuria, flank pain, discharge, penile pain and testicular pain.  Musculoskeletal: Negative for back pain.  Neurological: Negative for dizziness, weakness, numbness and headaches.  All other systems reviewed and are negative.    Allergies  Review of patient's allergies indicates no known allergies.  Home Medications   Current Outpatient Rx  Name Route Sig Dispense Refill  . CITALOPRAM HYDROBROMIDE 20 MG PO TABS Oral Take 20 mg by mouth daily.      Marland Kitchen METHOCARBAMOL 500 MG PO TABS Oral Take 500 mg by mouth 4 (four) times daily.    Marland Kitchen OMEPRAZOLE 20 MG PO CPDR Oral Take 20 mg by mouth daily.      . OXYCODONE-ACETAMINOPHEN 10-325 MG PO TABS Oral Take 1 tablet by mouth every 6 (six) hours as needed. For pain    . TRAMADOL HCL 50 MG PO TABS Oral Take 50 mg by mouth every 6 (six) hours as needed. For pain      BP 144/67  Pulse 62  Temp(Src) 98.2 F (36.8 C) (Oral)  Resp 24  SpO2 100%  Physical Exam  Constitutional: He is oriented to person, place, and time. He appears well-developed and well-nourished.  HENT:  Head: Normocephalic and atraumatic.  Eyes: Conjunctivae are normal. Pupils are equal,  round, and reactive to light.  Neck: Normal range of motion. Neck supple.  Cardiovascular: Normal rate, regular rhythm and normal heart sounds.   Pulmonary/Chest: Effort normal and breath sounds normal.  Abdominal: Soft. Bowel sounds are normal.  Musculoskeletal:       Right shoulder mildly edematous but healing well. No erythema or signs of infection  Neurological: He is alert and oriented to person, place, and time.  Skin: Skin is warm and dry. No rash noted. No erythema. No pallor.  Psychiatric: He has a normal mood and affect. His behavior is normal.    ED Course  Procedures  ED ECG REPORT    Date: 05/30/2011  EKG Time: 2:04 PM  Rate: 59  Rhythm: Sinus rhythm  Axis: nml  Intervals:none  ST&T Change: none  Narrative Interpretation: nml        Results for orders placed during the hospital encounter of 05/30/11  CBC      Component Value Range   WBC 8.5  4.0 - 10.5 (K/uL)   RBC 4.30  4.22 - 5.81 (MIL/uL)   Hemoglobin 13.1  13.0 - 17.0 (g/dL)   HCT 16.1 (*) 09.6 - 52.0 (%)   MCV 90.5  78.0 - 100.0 (fL)   MCH 30.5  26.0 - 34.0 (pg)   MCHC 33.7  30.0 - 36.0 (g/dL)   RDW 04.5  40.9 - 81.1 (%)   Platelets 240  150 - 400 (K/uL)  DIFFERENTIAL      Component Value Range   Neutrophils Relative 69  43 - 77 (%)   Neutro Abs 5.9  1.7 - 7.7 (K/uL)   Lymphocytes Relative 22  12 - 46 (%)   Lymphs Abs 1.9  0.7 - 4.0 (K/uL)   Monocytes Relative 9  3 - 12 (%)   Monocytes Absolute 0.8  0.1 - 1.0 (K/uL)   Eosinophils Relative 0  0 - 5 (%)   Eosinophils Absolute 0.0  0.0 - 0.7 (K/uL)   Basophils Relative 0  0 - 1 (%)   Basophils Absolute 0.0  0.0 - 0.1 (K/uL)  POCT I-STAT, CHEM 8      Component Value Range   Sodium 143  135 - 145 (mEq/L)   Potassium 4.8  3.5 - 5.1 (mEq/L)   Chloride 115 (*) 96 - 112 (mEq/L)   BUN 38 (*) 6 - 23 (mg/dL)   Creatinine, Ser 9.14  0.50 - 1.35 (mg/dL)   Glucose, Bld 81  70 - 99 (mg/dL)   Calcium, Ion 7.82 (*) 1.12 - 1.32 (mmol/L)   TCO2 22  0 - 100 (mmol/L)   Hemoglobin 13.6  13.0 - 17.0 (g/dL)   HCT 95.6  21.3 - 08.6 (%)  POCT I-STAT TROPONIN I      Component Value Range   Troponin i, poc 0.00  0.00 - 0.08 (ng/mL)   Comment 3            Dg Elbow Complete Left  05/02/2011  *RADIOLOGY REPORT*  Clinical Data: Soft tissue swelling around the left elbow since a fall 2 weeks ago.  LEFT ELBOW - COMPLETE 3+ VIEW  Comparison: 04/15/2011  Findings: There is no joint effusion or fracture.  Spurring is present at the coronoid process of the ulna.  Prominent soft tissue posterior to the olecranon.  This may represent a variant of olecranon bursitis.   IMPRESSION: No joint effusion or acute osseous abnormality.  Soft tissue swelling posteriorly.  Original Report Authenticated By: Gwynn Burly, M.D.  Ct Angio Chest W/cm &/or Wo Cm  05/30/2011  *RADIOLOGY REPORT*  Clinical Data: Shortness of breath after recent shoulder surgery.  CT ANGIOGRAPHY CHEST  Technique:  Multidetector CT imaging of the chest using the standard protocol during bolus administration of intravenous contrast. Multiplanar reconstructed images including MIPs were obtained and reviewed to evaluate the vascular anatomy.  Contrast: 68mL OMNIPAQUE IOHEXOL 300 MG/ML IV SOLN  Comparison: None.  Findings: Image quality is degraded somewhat by respiratory motion. No definite pulmonary embolus.  No pathologically enlarged mediastinal, hilar or axillary lymph nodes.  Coronary artery calcification.  Heart size normal.  No pericardial effusion.  Small hiatal hernia.  Again, respiratory motion degrades image quality.  There are a few scattered tiny pulmonary nodules, measuring 4 mm less in size (example image 63, right lower lobe).  No pleural fluid.  Airway is unremarkable.  Incidental imaging of the upper abdomen shows no acute findings. No worrisome lytic or sclerotic lesions.  IMPRESSION:  1.  No pulmonary embolus. 2.  Coronary artery calcification. 3.  Scattered tiny pulmonary nodules are nonspecific. If the patient is at high risk for bronchogenic carcinoma, follow-up chest CT at 1 year is recommended.  If the patient is at low risk, no follow-up is needed.  This recommendation follows the consensus statement: Guidelines for Management of Small Pulmonary Nodules Detected on CT Scans:  A Statement from the Fleischner Society as published in Radiology 2005; 237:395-400.  Available online at: DietDisorder.cz.  Original Report Authenticated By: Reyes Ivan, M.D.     MDM   1:29 PM Will order CT scan to rule out pulmonary embolism. Patient has a  significant history of being postop 10 days and tachypnea. Will also get some pain medication for shoulder pain. Reports he has not taken his medication today.   3:47 PM Patient has a negative CT angiogram pulmonary embolism. Requesting a GI cocktail for heartburn pain. Advise close followup with primary care physician and any return to ED for worsening symptoms. Stressed if patient should have worsening chest pain or shortness of breath it is very important that he return for reevaluation. Patient agrees with plan and is ready for discharge.   Thomasene Lot, PA-C 05/30/11 1551

## 2011-05-30 NOTE — ED Notes (Signed)
05/30/11 Physician Summary of initial visit at [facility] from approx 11:35 AM to 11:55 AM  Patient presented with his wife to our facility approximately 11:35 AM . He complained of shortness of breath, onset 3 days ago, much worse this morning. Had an episode of lightheadedness but no syncope. At first she denied any chest discomfort but later on in the visit, he admitted to substernal chest discomfort, moderate intensity. No radiation. Symptoms worsened with exertion.  Past medical history: Negative for any known cardiovascular disease. He just had left rotator cuff surgical repair by Dr. Margaretha Sheffield at [facility] on 05/21/11, and he feels he is doing well postoperatively.  Review of systems: Minimal nausea, but no vomiting. Denies abdominal pain. No visual change. No ENT symptoms. Denies neck pain. No rash.  Physical exam: BP 145/75. O2 saturation 100% on room air. Pulse 70. Respiratory rate 30. In general, he appears very dyspneic in mild respiratory discomfort. Mildly pale. Alert and cooperative. HEENT: Pharynx clear. Neck: Supple, and nontender, carotids 2+ and equal.  Lungs: Increased respiratory rate. Dyspneic. Lungs clear, with good air movement bilaterally anteriorly and posteriorly. No rhonchi or wheezes or rales. Heart: Regular rate and rhythm without murmur gallop or rub. Abdomen: Nondistended. Nontender. Extremities: No cyanosis clubbing or edema. Skin: Slightly pale. No rash. Mild diaphoresis.  We then immediately called for EMS, at approximately 11:40 AM, and they arrived approximately 11:53 AM.  Patient was started on oxygen at 2 L per minute. I gave him aspirin 325 mg by mouth, to chew up and swallow, stat. When he complained of substernal chest discomfort, we gave nitroglycerin 0.4 mg sublingually, and within about 4 minutes, he stated that the chest discomfort was lessening.  We were about to do an EKG, and EMS arrived and EMS performed EKG which showed  normal sinus rhythm and no definite acute abnormality. MDM: In my opinion, patient needed stat transport to [facility] ED for further evaluation and treatment. He likely has some type of acute cardiorespiratory event. Need to rule out acute MI. Need to rule out pulmonary embolus. Need to rule out other cardiorespiratory causes for his shortness of breath and chest pain.  With the patient and wife's permission, proceeded with EMS transport stat to [facility] ED for further evaluation and treatment.    [name], MD 05/30/11 [phone]

## 2011-05-30 NOTE — ED Notes (Signed)
Pt c/o SOB and center of chest pain x 3 days, worse x this AM. Pt had a LT rotator cuff repair on 05/21/2011 with Dr Margaretha Sheffield.

## 2011-05-30 NOTE — ED Notes (Signed)
Called report to [facility] ED charge nurse.

## 2011-06-04 NOTE — ED Provider Notes (Signed)
Medical screening examination/treatment/procedure(s) were performed by non-physician practitioner and as supervising physician I was immediately available for consultation/collaboration.  Juliet Rude. Rubin Payor, MD 06/04/11 [phone]

## 2011-07-03 ENCOUNTER — Ambulatory Visit: Payer: Medicare Other | Attending: Orthopedic Surgery | Admitting: Physical Therapy

## 2011-07-03 DIAGNOSIS — M6281 Muscle weakness (generalized): Secondary | ICD-10-CM | POA: Insufficient documentation

## 2011-07-03 DIAGNOSIS — M25619 Stiffness of unspecified shoulder, not elsewhere classified: Secondary | ICD-10-CM | POA: Insufficient documentation

## 2011-07-03 DIAGNOSIS — M25519 Pain in unspecified shoulder: Secondary | ICD-10-CM | POA: Insufficient documentation

## 2011-07-03 DIAGNOSIS — IMO0001 Reserved for inherently not codable concepts without codable children: Secondary | ICD-10-CM | POA: Insufficient documentation

## 2011-07-05 ENCOUNTER — Encounter: Payer: Medicare Other | Admitting: Physical Therapy

## 2011-07-09 ENCOUNTER — Ambulatory Visit: Payer: Medicare Other | Admitting: Physical Therapy

## 2011-07-09 ENCOUNTER — Ambulatory Visit: Payer: Medicare Other | Attending: Orthopedic Surgery | Admitting: Physical Therapy

## 2011-07-09 DIAGNOSIS — M25519 Pain in unspecified shoulder: Secondary | ICD-10-CM | POA: Insufficient documentation

## 2011-07-09 DIAGNOSIS — M25619 Stiffness of unspecified shoulder, not elsewhere classified: Secondary | ICD-10-CM | POA: Insufficient documentation

## 2011-07-09 DIAGNOSIS — IMO0001 Reserved for inherently not codable concepts without codable children: Secondary | ICD-10-CM | POA: Insufficient documentation

## 2011-07-09 DIAGNOSIS — M6281 Muscle weakness (generalized): Secondary | ICD-10-CM | POA: Insufficient documentation

## 2011-07-11 ENCOUNTER — Ambulatory Visit: Payer: Medicare Other | Admitting: Physical Therapy

## 2011-07-16 ENCOUNTER — Ambulatory Visit: Payer: Medicare Other | Admitting: Physical Therapy

## 2011-07-18 ENCOUNTER — Ambulatory Visit: Payer: Medicare Other | Admitting: Physical Therapy

## 2011-07-23 ENCOUNTER — Ambulatory Visit: Payer: Medicare Other | Admitting: Physical Therapy

## 2011-07-25 ENCOUNTER — Encounter: Payer: Medicare Other | Admitting: Physical Therapy

## 2011-07-26 ENCOUNTER — Ambulatory Visit: Payer: Medicare Other | Admitting: Physical Therapy

## 2011-07-30 ENCOUNTER — Ambulatory Visit: Payer: Medicare Other | Admitting: Physical Therapy

## 2011-08-01 ENCOUNTER — Ambulatory Visit: Payer: Medicare Other | Admitting: Physical Therapy

## 2011-08-06 ENCOUNTER — Ambulatory Visit: Payer: Medicare Other | Attending: Orthopedic Surgery | Admitting: Physical Therapy

## 2011-08-06 DIAGNOSIS — M25519 Pain in unspecified shoulder: Secondary | ICD-10-CM | POA: Insufficient documentation

## 2011-08-06 DIAGNOSIS — IMO0001 Reserved for inherently not codable concepts without codable children: Secondary | ICD-10-CM | POA: Insufficient documentation

## 2011-08-06 DIAGNOSIS — M25619 Stiffness of unspecified shoulder, not elsewhere classified: Secondary | ICD-10-CM | POA: Insufficient documentation

## 2011-08-06 DIAGNOSIS — M6281 Muscle weakness (generalized): Secondary | ICD-10-CM | POA: Insufficient documentation

## 2011-08-09 ENCOUNTER — Ambulatory Visit: Payer: Medicare Other | Admitting: Physical Therapy

## 2011-08-12 ENCOUNTER — Ambulatory Visit: Payer: Medicare Other | Admitting: Physical Therapy

## 2011-08-13 ENCOUNTER — Encounter: Payer: Medicare Other | Admitting: Physical Therapy

## 2011-08-15 ENCOUNTER — Ambulatory Visit: Payer: Medicare Other | Admitting: Physical Therapy

## 2011-08-20 ENCOUNTER — Ambulatory Visit: Payer: Medicare Other | Admitting: Physical Therapy

## 2011-08-22 ENCOUNTER — Ambulatory Visit: Payer: Medicare Other | Admitting: Physical Therapy

## 2011-08-22 ENCOUNTER — Encounter: Payer: Medicare Other | Admitting: Physical Therapy

## 2011-08-27 ENCOUNTER — Ambulatory Visit: Payer: Medicare Other | Admitting: Physical Therapy

## 2012-07-16 ENCOUNTER — Emergency Department (INDEPENDENT_AMBULATORY_CARE_PROVIDER_SITE_OTHER): Payer: Medicare Other

## 2012-07-16 ENCOUNTER — Encounter: Payer: Self-pay | Admitting: Emergency Medicine

## 2012-07-16 ENCOUNTER — Emergency Department
Admission: EM | Admit: 2012-07-16 | Discharge: 2012-07-16 | Disposition: A | Discharge status: Home / Self Care | Payer: Medicare Other | Source: Home / Self Care | Attending: Family Medicine | Admitting: Family Medicine

## 2012-07-16 DIAGNOSIS — J069 Acute upper respiratory infection, unspecified: Secondary | ICD-10-CM

## 2012-07-16 DIAGNOSIS — R05 Cough: Secondary | ICD-10-CM

## 2012-07-16 DIAGNOSIS — R059 Cough, unspecified: Secondary | ICD-10-CM

## 2012-07-16 DIAGNOSIS — R0989 Other specified symptoms and signs involving the circulatory and respiratory systems: Secondary | ICD-10-CM

## 2012-07-16 MED ORDER — AZITHROMYCIN 250 MG PO TABS: ORAL_TABLET | ORAL | Status: DC

## 2012-07-16 NOTE — ED Provider Notes (Signed)
History     CSN: [id number]  Arrival date & time 07/16/12  8295   First MD Initiated Contact with Patient 07/16/12 [phone]      Chief Complaint  Patient presents with  . URI    HPI  URI Symptoms Onset: 3 weeks  Description: rhinorrhea, nasal congestion, persistent cough Modifying factors:  Hx/o prostate ca. Medically treated currently. Went to [place] and developed some allergic symptoms prior to onset of symptoms. Symptoms have persisted. No CP or SOB.   Symptoms Nasal discharge: yes Fever: no Sore throat: no Cough: yes Wheezing: no Ear pain: no GI symptoms: no Sick contacts: no  Red Flags  Stiff neck: no Dyspnea: no Rash: no Swallowing difficulty: no  Sinusitis Risk Factors Headache/face pain: no Double sickening: no tooth pain: no  Allergy Risk Factors Sneezing: no Itchy scratchy throat: no Seasonal symptoms: no  Flu Risk Factors Headache: no muscle aches: no severe fatigue: no   Past Medical History  Diagnosis Date  . Prostate cancer   . Anxiety   . Malignant melanoma   . Insomnia   . GERD (gastroesophageal reflux disease)   . Left rotator cuff tear 05/21/2011    Past Surgical History  Procedure Laterality Date  . Hemorrhoid surgery    . Knee surgery    . Tonsillectomy    . Eye surgery      both cataracts  . Prostate surgery    . Rotator cuff repair  05/21/2011    Family History  Problem Relation Age of Onset  . Breast cancer Mother   . Prostate cancer Father   . Leukemia Brother     History  Substance Use Topics  . Smoking status: Never Smoker   . Smokeless tobacco: Not on file  . Alcohol Use: No      Review of Systems  All other systems reviewed and are negative.    Allergies  Review of patient's allergies indicates no known allergies.  Home Medications   Current Outpatient Rx  Name  Route  Sig  Dispense  Refill  . Alum & Mag Hydroxide-Simeth (GI COCKTAIL) SUSP   Oral   Take 30 mLs by mouth 2 (two) times daily as  needed.   300 mL   0   . citalopram (CELEXA) 20 MG tablet   Oral   Take 20 mg by mouth daily.           . methocarbamol (ROBAXIN) 500 MG tablet   Oral   Take 500 mg by mouth 4 (four) times daily.         Marland Kitchen omeprazole (PRILOSEC) 20 MG capsule   Oral   Take 20 mg by mouth daily.           Marland Kitchen oxyCODONE-acetaminophen (PERCOCET) 10-325 MG per tablet   Oral   Take 1 tablet by mouth every 6 (six) hours as needed. For pain         . traMADol (ULTRAM) 50 MG tablet   Oral   Take 50 mg by mouth every 6 (six) hours as needed. For pain           BP 112/72  Pulse 72  Temp(Src) 98 F (36.7 C) (Oral)  Ht 5\' 8"  (1.727 m)  Wt 165 lb (74.844 kg)  BMI 25.09 kg/m2  SpO2 96%  Physical Exam  Constitutional: He appears well-developed and well-nourished.  HENT:  Head: Normocephalic and atraumatic.  Right Ear: External ear normal.  Left Ear: External ear normal.  +nasal  erythema, rhinorrhea bilaterally, + post oropharyngeal erythema    Eyes: Conjunctivae are normal. Pupils are equal, round, and reactive to light.  Neck: Normal range of motion.  Cardiovascular: Normal rate, regular rhythm and normal heart sounds.   Pulmonary/Chest: Effort normal and breath sounds normal.  Abdominal: Soft.  Musculoskeletal: Normal range of motion.  Lymphadenopathy:    He has no cervical adenopathy.  Neurological: He is alert.  Skin: Skin is warm.    ED Course  Procedures (including critical care time)  Labs Reviewed - No data to display Dg Chest 2 View  07/16/2012  *RADIOLOGY REPORT*  Clinical Data: Cough and congestion.  CHEST - 2 VIEW  Comparison: CT chest 05/30/2011 and chest radiograph 07/13/2010.  Findings: Trachea is midline.  Heart size stable.  Lungs are somewhat low in volume but clear.  No pleural fluid.  IMPRESSION: No acute findings.   Original Report Authenticated By: Leanna Battles, M.D.      1. URI (upper respiratory infection)   2. Cough       MDM  CXR negative for  focal infiltrate. Will place on zpak for atypical coverage.  Wells score of around 1 (active malignancy and recent travel). Will check d-dimer.  Discussed infectious and resp red flags.  Follow up with PCP in 3-5 days.     The patient and/or caregiver has been counseled thoroughly with regard to treatment plan and/or medications prescribed including dosage, schedule, interactions, rationale for use, and possible side effects and they verbalize understanding. Diagnoses and expected course of recovery discussed and will return if not improved as expected or if the condition worsens. Patient and/or caregiver verbalized understanding.              Doree Albee, MD 07/16/12 1032

## 2012-07-16 NOTE — ED Notes (Signed)
Reports onset of respiratory allergies 3 weeks ago while visiting [place] which has progressed to URI and, according to him, possible "pneumonia". Is currently in treatment for prostate cancer. Has not had fever.

## 2012-07-18 ENCOUNTER — Other Ambulatory Visit: Payer: Self-pay | Admitting: Family Medicine

## 2012-07-18 ENCOUNTER — Encounter: Payer: Self-pay | Admitting: Emergency Medicine

## 2012-07-18 ENCOUNTER — Other Ambulatory Visit (HOSPITAL_BASED_OUTPATIENT_CLINIC_OR_DEPARTMENT_OTHER): Payer: Medicare Other

## 2012-07-18 ENCOUNTER — Ambulatory Visit (HOSPITAL_BASED_OUTPATIENT_CLINIC_OR_DEPARTMENT_OTHER)
Admission: RE | Admit: 2012-07-18 | Discharge: 2012-07-18 | Disposition: A | Discharge status: Home / Self Care | Payer: Medicare Other | Source: Ambulatory Visit | Attending: Family Medicine | Admitting: Family Medicine

## 2012-07-18 ENCOUNTER — Telehealth: Payer: Self-pay | Admitting: Family Medicine

## 2012-07-18 ENCOUNTER — Emergency Department (INDEPENDENT_AMBULATORY_CARE_PROVIDER_SITE_OTHER)
Admission: EM | Admit: 2012-07-18 | Discharge: 2012-07-18 | Disposition: A | Discharge status: Home / Self Care | Payer: Medicare Other | Source: Home / Self Care | Attending: Family Medicine | Admitting: Family Medicine

## 2012-07-18 DIAGNOSIS — I2510 Atherosclerotic heart disease of native coronary artery without angina pectoris: Secondary | ICD-10-CM | POA: Insufficient documentation

## 2012-07-18 DIAGNOSIS — I517 Cardiomegaly: Secondary | ICD-10-CM | POA: Insufficient documentation

## 2012-07-18 DIAGNOSIS — R059 Cough, unspecified: Secondary | ICD-10-CM

## 2012-07-18 DIAGNOSIS — R05 Cough: Secondary | ICD-10-CM

## 2012-07-18 DIAGNOSIS — R7989 Other specified abnormal findings of blood chemistry: Secondary | ICD-10-CM

## 2012-07-18 DIAGNOSIS — R079 Chest pain, unspecified: Secondary | ICD-10-CM | POA: Insufficient documentation

## 2012-07-18 DIAGNOSIS — R911 Solitary pulmonary nodule: Secondary | ICD-10-CM | POA: Insufficient documentation

## 2012-07-18 DIAGNOSIS — K449 Diaphragmatic hernia without obstruction or gangrene: Secondary | ICD-10-CM | POA: Insufficient documentation

## 2012-07-18 LAB — POCT CBC W AUTO DIFF (K'VILLE URGENT CARE)

## 2012-07-18 LAB — COMPREHENSIVE METABOLIC PANEL
Albumin: 4 g/dL (ref 3.5–5.2)
CO2: 25 mEq/L (ref 19–32)
Glucose, Bld: 80 mg/dL (ref 70–99)
Potassium: 4.6 mEq/L (ref 3.5–5.3)
Sodium: 142 mEq/L (ref 135–145)
Total Protein: 6.6 g/dL (ref 6.0–8.3)

## 2012-07-18 MED ORDER — IOHEXOL 350 MG/ML SOLN
100.0000 mL | Freq: Once | INTRAVENOUS | Status: AC | PRN
  Administered 2012-07-18: 100 mL via INTRAVENOUS

## 2012-07-18 MED ORDER — HYDROCOD POLST-CHLORPHEN POLST 10-8 MG/5ML PO LQCR: 5.0000 mL | Freq: Four times a day (QID) | ORAL | Status: DC | PRN

## 2012-07-18 NOTE — ED Notes (Signed)
Patient returns after initial visit [facility] on 07-16-12; has suggestions for his dx to discuss with MD. Cough and congestion persist.

## 2012-07-18 NOTE — ED Provider Notes (Signed)
History     CSN: [id number]  Arrival date & time 07/18/12  0913   First MD Initiated Contact with Patient 07/18/12 [phone]      Chief Complaint  Patient presents with  . Nasal Congestion  . Cough    HPI Patient presents in followup with your eye symptoms. Patient is noted have been recent seen 2 days ago for your eye symptoms status post a trip to [place]. A chest x-ray was obtained at this visit which was negative for any focal infiltrate. Patient was placed on course of azithromycin as patient had symptoms for the past 3 weeks. On further discussion today, patient states he's been seen for this previously by his PCP and complete a course of prednisone with no improvement in symptoms. Patient has been doing some reading and is concerned he may have valley fever. Patient denies any fevers, chills, shortness of breath. Patient does report some allergic symptoms within 24 hours of being in [place]. No increased work of breathing. Patient has had persistent rhinorrhea throughout the course of symptoms. Patient states he does use a vaporizer at home at night and this does seem to help with cough. Patient is also been using cough medicine with minimal improvement in symptoms. Patient is noted have a baseline history of prostate cancer is being medically followed by urology. Patient is currently on finasteride Flomax for treatment. Currently not on any chemotherapy agents. No baseline history of respiratory disease. Patient states his predominant issue has been cough which his why he is concerned about valley fever.  Past Medical History  Diagnosis Date  . Prostate cancer   . Anxiety   . Malignant melanoma   . Insomnia   . GERD (gastroesophageal reflux disease)   . Left rotator cuff tear 05/21/2011    Past Surgical History  Procedure Laterality Date  . Hemorrhoid surgery    . Knee surgery    . Tonsillectomy    . Eye surgery      both cataracts  . Prostate surgery    . Rotator cuff repair   05/21/2011    Family History  Problem Relation Age of Onset  . Breast cancer Mother   . Prostate cancer Father   . Leukemia Brother     History  Substance Use Topics  . Smoking status: Never Smoker   . Smokeless tobacco: Not on file  . Alcohol Use: No      Review of Systems  All other systems reviewed and are negative.    Allergies  Review of patient's allergies indicates no known allergies.  Home Medications   Current Outpatient Rx  Name  Route  Sig  Dispense  Refill  . Alum & Mag Hydroxide-Simeth (GI COCKTAIL) SUSP   Oral   Take 30 mLs by mouth 2 (two) times daily as needed.   300 mL   0   . azithromycin (ZITHROMAX) 250 MG tablet      Take 2 tabs PO x 1 dose, then 1 tab PO QD x 4 days   6 tablet   0   . citalopram (CELEXA) 20 MG tablet   Oral   Take 20 mg by mouth daily.           . methocarbamol (ROBAXIN) 500 MG tablet   Oral   Take 500 mg by mouth 4 (four) times daily.         Marland Kitchen omeprazole (PRILOSEC) 20 MG capsule   Oral   Take 20 mg by mouth daily.           Marland Kitchen  oxyCODONE-acetaminophen (PERCOCET) 10-325 MG per tablet   Oral   Take 1 tablet by mouth every 6 (six) hours as needed. For pain         . traMADol (ULTRAM) 50 MG tablet   Oral   Take 50 mg by mouth every 6 (six) hours as needed. For pain           There were no vitals taken for this visit.  Physical Exam  Constitutional: He appears well-developed and well-nourished.  HENT:  Head: Normocephalic and atraumatic.  Right Ear: External ear normal.  Left Ear: External ear normal.  +nasal erythema, rhinorrhea bilaterally, + post oropharyngeal erythema    Eyes: Conjunctivae are normal. Pupils are equal, round, and reactive to light.  Neck: Normal range of motion. Neck supple.  Cardiovascular: Normal rate and regular rhythm.   Pulmonary/Chest: Effort normal and breath sounds normal. No respiratory distress. He has no wheezes. He has no rales. He exhibits no tenderness.   Abdominal: Soft. Bowel sounds are normal.  Musculoskeletal: Normal range of motion.  Lymphadenopathy:    He has no cervical adenopathy.  Neurological: He is alert.  Skin: Skin is warm.    ED Course  Procedures (including critical care time)  Labs Reviewed - No data to display No results found.   1. Cough       MDM  Overall symptoms are fairly mild. Symptoms do not seem clinically consistent with classic case of valley fever. Recent chest x-ray without infiltrate. No fever, weight loss, shortness of breath, increased work of breathing. Case was discussed with on-call pulmonologist Dr. De Burrs. Agrees with assessment workup thus far. Will check Coccidioides antibodies today as well as CMET. CBC today with a white count of around 5 which is reassuring. Will place patient on Tussionex for cough. Patient is to complete course of Z-Pak. Initial d-dimer cannot be completed. Will recheck this today. Plan for followup with pulmonology on Monday for reevaluation. Discussed infectious and respiratory red flags in length with patient including shortness of breath and fever. Otherwise followup with pulmonology on Monday.    The patient and/or caregiver has been counseled thoroughly with regard to treatment plan and/or medications prescribed including dosage, schedule, interactions, rationale for use, and possible side effects and they verbalize understanding. Diagnoses and expected course of recovery discussed and will return if not improved as expected or if the condition worsens. Patient and/or caregiver verbalized understanding.                Doree Albee, MD 07/18/12 1113

## 2012-07-18 NOTE — ED Notes (Signed)
Lab Results  Component Value Date   DDIMER 0.55* 07/18/2012   Called to followup with patient about minimally elevated d-dimer. Discuss lab results with patient. Patient noted to have had a recent CT angiogram back in January that was negative for any type of pulmonary embolus status post surgery. Discuss with patient that while d-dimer is borderline elevated there is some risk of VTE given prostate cancer. Patient noted to have been seen at the [facility] urgent care twice over the past 3 days with no noted tachycardia or hypoxia. Patient is denying any shortness of breath currently although patient did recently travel from [place] within the past 2 weeks (please see recent urgent care notes for further details). Discussed testing with patient. Patient declined testing and said he would reconsider it over the weekend pending pulmonology followup.  Clinical update: As I was writing this note, patient's healthcare power of attorney (daughter) called. Discussed patient's case at length with her (patient is also near the phone). Case was relayed to patient's spouse as well via [facility]CPOA. Healthcare power of attorney is agreeable to obtaining testing. We'll have our nurse here contact healthcare power of attorney to have testing done.    Doree Albee, MD 07/18/12 [phone]

## 2012-07-20 ENCOUNTER — Telehealth: Payer: Self-pay | Admitting: *Deleted

## 2012-07-23 LAB — COCCIDIOIDES ANTIBODIES: Coccidioides Ab CF: <1:2 {titer}

## 2014-05-11 ENCOUNTER — Emergency Department
Admission: EM | Admit: 2014-05-11 | Discharge: 2014-05-11 | Disposition: A | Discharge status: Home / Self Care | Payer: Medicare Other | Source: Home / Self Care | Attending: Family Medicine | Admitting: Family Medicine

## 2014-05-11 ENCOUNTER — Encounter: Payer: Self-pay | Admitting: *Deleted

## 2014-05-11 ENCOUNTER — Emergency Department (INDEPENDENT_AMBULATORY_CARE_PROVIDER_SITE_OTHER): Payer: Medicare Other

## 2014-05-11 DIAGNOSIS — J181 Lobar pneumonia, unspecified organism: Principal | ICD-10-CM

## 2014-05-11 DIAGNOSIS — J189 Pneumonia, unspecified organism: Secondary | ICD-10-CM

## 2014-05-11 DIAGNOSIS — R059 Cough, unspecified: Secondary | ICD-10-CM

## 2014-05-11 DIAGNOSIS — R05 Cough: Secondary | ICD-10-CM

## 2014-05-11 DIAGNOSIS — J9811 Atelectasis: Secondary | ICD-10-CM

## 2014-05-11 MED ORDER — CEFDINIR 300 MG PO CAPS: 300.0000 mg | ORAL_CAPSULE | Freq: Two times a day (BID) | ORAL | Status: DC

## 2014-05-11 MED ORDER — BENZONATATE 200 MG PO CAPS: 200.0000 mg | ORAL_CAPSULE | Freq: Every day | ORAL | Status: DC

## 2014-05-11 NOTE — ED Provider Notes (Signed)
CSN: [id number]     Arrival date & time 05/11/14  0906 History   First MD Initiated Contact with Patient 05/11/14 [phone]     Chief Complaint  Patient presents with  . Cough  . Nasal Congestion        HPI Comments: Patient complains of four day history of typical cold-like symptoms including mild sore throat, sinus congestion, headache, fatigue, myalgias, chills, and frequent cough.  He has now developed right pleuritic pain, and the cough awakens him at night.  The history is provided by the patient and the spouse.    Past Medical History  Diagnosis Date  . Prostate cancer   . Anxiety   . Malignant melanoma   . Insomnia   . GERD (gastroesophageal reflux disease)   . Left rotator cuff tear 05/21/2011   Past Surgical History  Procedure Laterality Date  . Hemorrhoid surgery    . Knee surgery    . Tonsillectomy    . Eye surgery      both cataracts  . Prostate surgery    . Rotator cuff repair  05/21/2011   Family History  Problem Relation Age of Onset  . Breast cancer Mother   . Prostate cancer Father   . Leukemia Brother    History  Substance Use Topics  . Smoking status: Never Smoker   . Smokeless tobacco: Never Used  . Alcohol Use: No    Review of Systems + sore throat + cough + pleuritic pain left lateral No wheezing + nasal congestion + post-nasal drainage No sinus pain/pressure No itchy/red eyes No earache No hemoptysis No SOB No fever, + chills No nausea No vomiting No abdominal pain No diarrhea No urinary symptoms No skin rash + fatigue + myalgias + headache Used OTC meds without relief  Allergies  Review of patient's allergies indicates no known allergies.  Home Medications   Prior to Admission medications   Medication Sig Start Date End Date Taking? Authorizing Provider  citalopram (CELEXA) 20 MG tablet Take 20 mg by mouth daily.     Yes Historical Provider, MD  omeprazole (PRILOSEC) 20 MG capsule Take 20 mg by mouth daily.     Yes  Historical Provider, MD  zolpidem (AMBIEN) 5 MG tablet Take 5 mg by mouth at bedtime as needed for sleep.   Yes Historical Provider, MD  benzonatate (TESSALON) 200 MG capsule Take 1 capsule (200 mg total) by mouth at bedtime. Take as needed for cough 05/11/14   Kandra Nicolas, MD  cefdinir (OMNICEF) 300 MG capsule Take 1 capsule (300 mg total) by mouth 2 (two) times daily. 05/11/14   Kandra Nicolas, MD   BP 142/67 mmHg  Pulse 76  Temp(Src) 98.4 F (36.9 C) (Oral)  Resp 18  Wt 162 lb (73.483 kg)  SpO2 96% Physical Exam Nursing notes and Vital Signs reviewed. Appearance:  Patient appears healthy, stated age, and in no acute distress Eyes:  Pupils are equal, round, and reactive to light and accomodation.  Extraocular movement is intact.  Conjunctivae are not inflamed  Ears:  Canals normal.  Tympanic membranes normal.  Nose:  Congested turbinates.  No sinus tenderness.   Pharynx:  Normal Neck:  Supple.  Tender enlarged posterior nodes are palpated bilaterally  Lungs:  Clear to auscultation.  Breath sounds are equal.  Heart:  Regular rate and rhythm without murmurs, rubs, or gallops.  Abdomen:  Nontender without masses or hepatosplenomegaly.  Bowel sounds are present.  No CVA or flank  tenderness.  Extremities:  No edema.  No calf tenderness Skin:  No rash present.   ED Course  Procedures       Imaging Review Dg Chest 2 View  05/11/2014   CLINICAL DATA:  Productive cough for the past 5 days with flu-like symptoms; history of prostate malignancy  EXAM: CHEST  2 VIEW  COMPARISON:  Chest CT scan of July 18, 2012 and PA and lateral chest x-ray of Sep 07, 2013.  FINDINGS: The lungs are mildly hypoinflated. There are coarse increased lung markings in the retrocardiac region likely on the right. There is no pleural effusion. The heart is mildly enlarged though there is no pulmonary vascular congestion. There is no pleural effusion or pneumothorax. The trachea is midline. The bony thorax exhibits no  acute abnormalities.  IMPRESSION: Right lower lobe subsegmental atelectasis or early pneumonia. There is no evidence of CHF nor pleural effusion.   Electronically Signed   By: David  Martinique   On: 05/11/2014 10:29     MDM   1. Right lower lobe pneumonia   2. Cough:  Suspect viral URI    Begin Omnicef.  Prescription written for Benzonatate [facility]) to take at bedtime for night-time cough.  Take plain Mucinex (1200 mg guaifenesin) twice daily for cough and congestion.  May add Pseudoephedrine for sinus congestion.   Increase fluid intake, rest. May use Afrin nasal spray (or generic oxymetazoline) twice daily for about 5 days.  Also recommend using saline nasal spray several times daily and saline nasal irrigation (AYR is a common brand) Try warm salt water gargles for sore throat.  Stop all antihistamines for now, and other non-prescription cough/cold preparations. Followup with Family Doctor in one week. If symptoms become significantly worse during the night or over the weekend, proceed to the local emergency room.     Kandra Nicolas, MD 05/11/14 1048

## 2014-05-11 NOTE — Discharge Instructions (Signed)
Take plain Mucinex (1200 mg guaifenesin) twice daily for cough and congestion.  May add Pseudoephedrine for sinus congestion.   Increase fluid intake, rest. May use Afrin nasal spray (or generic oxymetazoline) twice daily for about 5 days.  Also recommend using saline nasal spray several times daily and saline nasal irrigation (AYR is a common brand) Try warm salt water gargles for sore throat.  Stop all antihistamines for now, and other non-prescription cough/cold preparations.

## 2014-05-11 NOTE — ED Notes (Signed)
Pt c/o productive cough, congestion, SOB, sneezing x 4days. Taken Day/Nyquil, ASA, Mucinex and Delsym without relief.

## 2014-05-31 ENCOUNTER — Other Ambulatory Visit: Payer: Self-pay | Admitting: Family Medicine

## 2014-05-31 ENCOUNTER — Ambulatory Visit (INDEPENDENT_AMBULATORY_CARE_PROVIDER_SITE_OTHER): Payer: Medicare Other

## 2014-05-31 DIAGNOSIS — J189 Pneumonia, unspecified organism: Secondary | ICD-10-CM

## 2014-05-31 DIAGNOSIS — J159 Unspecified bacterial pneumonia: Secondary | ICD-10-CM

## 2015-12-12 ENCOUNTER — Ambulatory Visit
Admission: RE | Admit: 2015-12-12 | Discharge: 2015-12-12 | Disposition: A | Discharge status: Home / Self Care | Payer: Medicare Other | Source: Ambulatory Visit | Attending: Family Medicine | Admitting: Family Medicine

## 2015-12-12 ENCOUNTER — Other Ambulatory Visit: Payer: Self-pay | Admitting: Family Medicine

## 2015-12-12 DIAGNOSIS — R0781 Pleurodynia: Secondary | ICD-10-CM

## 2015-12-12 DIAGNOSIS — R109 Unspecified abdominal pain: Secondary | ICD-10-CM

## 2016-03-26 ENCOUNTER — Other Ambulatory Visit: Payer: Self-pay | Admitting: Family Medicine

## 2016-03-26 ENCOUNTER — Ambulatory Visit
Admission: RE | Admit: 2016-03-26 | Discharge: 2016-03-26 | Disposition: A | Discharge status: Home / Self Care | Payer: Medicare Other | Source: Ambulatory Visit | Attending: Family Medicine | Admitting: Family Medicine

## 2016-03-26 DIAGNOSIS — J209 Acute bronchitis, unspecified: Secondary | ICD-10-CM

## 2016-07-04 DIAGNOSIS — F0390 Unspecified dementia without behavioral disturbance: Secondary | ICD-10-CM

## 2016-07-04 HISTORY — DX: Unspecified dementia, unspecified severity, without behavioral disturbance, psychotic disturbance, mood disturbance, and anxiety: F03.90

## 2016-07-25 ENCOUNTER — Emergency Department (HOSPITAL_COMMUNITY): Payer: Medicare Other

## 2016-07-25 ENCOUNTER — Emergency Department (HOSPITAL_COMMUNITY)
Admission: EM | Admit: 2016-07-25 | Discharge: 2016-07-25 | Disposition: A | Discharge status: Home / Self Care | Payer: Medicare Other | Attending: Emergency Medicine | Admitting: Emergency Medicine

## 2016-07-25 ENCOUNTER — Encounter (HOSPITAL_COMMUNITY): Payer: Self-pay

## 2016-07-25 ENCOUNTER — Encounter: Payer: Self-pay | Admitting: *Deleted

## 2016-07-25 ENCOUNTER — Emergency Department
Admission: EM | Admit: 2016-07-25 | Discharge: 2016-07-25 | Disposition: A | Discharge status: Other Acute Inpatient Hospital | Payer: Medicare Other | Source: Home / Self Care | Attending: Emergency Medicine | Admitting: Emergency Medicine

## 2016-07-25 DIAGNOSIS — Z79899 Other long term (current) drug therapy: Secondary | ICD-10-CM | POA: Diagnosis not present

## 2016-07-25 DIAGNOSIS — R1031 Right lower quadrant pain: Secondary | ICD-10-CM | POA: Diagnosis not present

## 2016-07-25 DIAGNOSIS — N200 Calculus of kidney: Secondary | ICD-10-CM

## 2016-07-25 DIAGNOSIS — Z85820 Personal history of malignant melanoma of skin: Secondary | ICD-10-CM | POA: Insufficient documentation

## 2016-07-25 DIAGNOSIS — K5730 Diverticulosis of large intestine without perforation or abscess without bleeding: Secondary | ICD-10-CM | POA: Diagnosis not present

## 2016-07-25 DIAGNOSIS — I700 Atherosclerosis of aorta: Secondary | ICD-10-CM

## 2016-07-25 DIAGNOSIS — K760 Fatty (change of) liver, not elsewhere classified: Secondary | ICD-10-CM

## 2016-07-25 DIAGNOSIS — Z8546 Personal history of malignant neoplasm of prostate: Secondary | ICD-10-CM | POA: Insufficient documentation

## 2016-07-25 DIAGNOSIS — R109 Unspecified abdominal pain: Secondary | ICD-10-CM | POA: Diagnosis present

## 2016-07-25 HISTORY — DX: Unspecified dementia without behavioral disturbance: F03.90

## 2016-07-25 LAB — COMPREHENSIVE METABOLIC PANEL
ALT: 37 U/L (ref 17–63)
AST: 42 U/L — ABNORMAL HIGH (ref 15–41)
Albumin: 4 g/dL (ref 3.5–5.0)
Alkaline Phosphatase: 51 U/L (ref 38–126)
Anion gap: 12 (ref 5–15)
BILIRUBIN TOTAL: 1.3 mg/dL — AB (ref 0.3–1.2)
BUN: 20 mg/dL (ref 6–20)
CHLORIDE: 106 mmol/L (ref 101–111)
CO2: 22 mmol/L (ref 22–32)
CREATININE: 1.33 mg/dL — AB (ref 0.61–1.24)
Calcium: 10 mg/dL (ref 8.9–10.3)
GFR, EST AFRICAN AMERICAN: 57 mL/min — AB (ref >60)
GFR, EST NON AFRICAN AMERICAN: 50 mL/min — AB (ref >60)
Glucose, Bld: 84 mg/dL (ref 65–99)
POTASSIUM: 4.8 mmol/L (ref 3.5–5.1)
Sodium: 140 mmol/L (ref 135–145)
TOTAL PROTEIN: 8.1 g/dL (ref 6.5–8.1)

## 2016-07-25 LAB — POCT URINALYSIS DIP (MANUAL ENTRY)
Bilirubin, UA: NEGATIVE
Glucose, UA: NEGATIVE
Ketones, POC UA: NEGATIVE
Leukocytes, UA: NEGATIVE
Nitrite, UA: NEGATIVE
Protein Ur, POC: NEGATIVE
Spec Grav, UA: 1.02 (ref 1.030–1.035)
Urobilinogen, UA: 0.2 (ref ?–2.0)
pH, UA: 6 (ref 5.0–8.0)

## 2016-07-25 LAB — URINALYSIS, ROUTINE W REFLEX MICROSCOPIC
Bilirubin Urine: NEGATIVE
Glucose, UA: NEGATIVE mg/dL
HGB URINE DIPSTICK: NEGATIVE
KETONES UR: NEGATIVE mg/dL
Leukocytes, UA: NEGATIVE
Nitrite: NEGATIVE
PROTEIN: NEGATIVE mg/dL
Specific Gravity, Urine: 1.01 (ref 1.005–1.030)
pH: 8 (ref 5.0–8.0)

## 2016-07-25 LAB — POCT CBC W AUTO DIFF (K'VILLE URGENT CARE)

## 2016-07-25 LAB — CBC WITH DIFFERENTIAL/PLATELET
Basophils Absolute: 0 10*3/uL (ref 0.0–0.1)
Basophils Relative: 0 %
EOS PCT: 1 %
Eosinophils Absolute: 0 10*3/uL (ref 0.0–0.7)
HCT: 43.3 % (ref 39.0–52.0)
Hemoglobin: 14.6 g/dL (ref 13.0–17.0)
LYMPHS ABS: 2.5 10*3/uL (ref 0.7–4.0)
Lymphocytes Relative: 30 %
MCH: 30.5 pg (ref 26.0–34.0)
MCHC: 33.7 g/dL (ref 30.0–36.0)
MCV: 90.6 fL (ref 78.0–100.0)
MONO ABS: 0.5 10*3/uL (ref 0.1–1.0)
Monocytes Relative: 6 %
Neutro Abs: 5.3 10*3/uL (ref 1.7–7.7)
Neutrophils Relative %: 63 %
PLATELETS: 205 10*3/uL (ref 150–400)
RBC: 4.78 MIL/uL (ref 4.22–5.81)
RDW: 13.4 % (ref 11.5–15.5)
WBC: 8.4 10*3/uL (ref 4.0–10.5)

## 2016-07-25 LAB — LIPASE, BLOOD: LIPASE: 22 U/L (ref 11–51)

## 2016-07-25 LAB — TROPONIN I

## 2016-07-25 MED ORDER — SODIUM CHLORIDE 0.9 % IV BOLUS (SEPSIS)
1000.0000 mL | Freq: Once | INTRAVENOUS | Status: AC
  Administered 2016-07-25: 1000 mL via INTRAVENOUS

## 2016-07-25 MED ORDER — SODIUM CHLORIDE 0.9 % IV BOLUS (SEPSIS): 1000.0000 mL | Freq: Once | INTRAVENOUS | Status: DC

## 2016-07-25 MED ORDER — MORPHINE SULFATE (PF) 4 MG/ML IV SOLN
4.0000 mg | Freq: Once | INTRAVENOUS | Status: AC
  Administered 2016-07-25: 4 mg via INTRAVENOUS
  Filled 2016-07-25: qty 1

## 2016-07-25 MED ORDER — IOPAMIDOL (ISOVUE-300) INJECTION 61%
INTRAVENOUS | Status: AC
  Administered 2016-07-25: 100 mL
  Filled 2016-07-25: qty 100

## 2016-07-25 NOTE — Discharge Instructions (Signed)
Jack Howard, I am glad your abdominal pain has gotten better with the pain medications. The images of your abdomen showed that you have a fatty liver, diverticulosis and a non obstructing right kidney stone. Your pain and diarrhea may be related to a stomach virus. We have ruled out the most dangerous causes that could be causing your pain. Please schedule an appointment to see your primary care doctor as soon as possible. Seek medical attention if your pain becomes unbearable or becomes associated with any new symptoms like vomiting or you stop having bowel movements.

## 2016-07-25 NOTE — ED Triage Notes (Signed)
Patient from urgent care today with CO of RLQ pain that has been going on for the past 2 days that got more severe at 3AM this morning. No CO of fevers, nausea or vomiting.

## 2016-07-25 NOTE — ED Notes (Signed)
Papers reviewed with patient with resident in the room. IV removed and he verbalizes understanding

## 2016-07-25 NOTE — ED Notes (Signed)
Called report to [facility] ED. Transporting to [employer] via [facility]

## 2016-07-25 NOTE — ED Provider Notes (Signed)
[facility] DEPT Provider Note   CSN: [id number] Arrival date & time: 07/25/16  9562     History   Chief Complaint Chief Complaint  Patient presents with  . Abdominal Pain    HPI Jack Howard is a 81 y.o. male. PMH alzheimer's dementia, prostate cancer, GERD, peptic ulcer disease, nephrolithiasis and anxiety presents with right lower side pain that began yesterday. The pain is constant with intermittent sharp pains. Pain is associated with diarrhea, nausea, and decreased appetite. The pain is made worse by movement. It is 10/10 right now. It is non radiating and he denies fever or chills. He denies dysuria or hematuria and has not had increase in urinary frequency. He has no history of abdominal surgery or gallstones. Denies alcohol or drug use.   He went to the urgent care in [place] today and was found to be bradycardic (HR 52)  and hypotensive (BP 99/63.) Urinalysis showed small amounts of blood but no signs of infection. WBC count 7.2. There was concern for acute abdomen to he was transported via EMS to the [facility].   HPI  Past Medical History:  Diagnosis Date  . Anxiety   . Dementia 07/04/2016   new diagnosis of early stages of alzheimers dementia 3 weeks ago   . GERD (gastroesophageal reflux disease)   . Insomnia   . Left rotator cuff tear 05/21/2011  . Malignant melanoma ([place])   . Prostate cancer [facility])     Patient Active Problem List   Diagnosis Date Noted  . Left rotator cuff tear 05/21/2011  . UNSPECIFIED SUDDEN HEARING LOSS 01/06/2011  . PROSTATE CANCER 04/15/2008  . HYPOGLYCEMIA 04/15/2008  . ANXIETY 04/15/2008  . BRADYCARDIA 04/15/2008  . ESOPHAGEAL REFLUX 04/15/2008  . INSOMNIA 04/15/2008  . COUGH 04/15/2008  . CHEST PAIN, RECURRENT 04/15/2008  . MALIGNANT MELANOMA, HX OF 04/15/2008  . BENIGN PROSTATIC HYPERTROPHY, HX OF 04/15/2008    Past Surgical History:  Procedure Laterality Date  . EYE SURGERY     both cataracts  . HEMORRHOID  SURGERY    . KNEE SURGERY    . PROSTATE SURGERY    . ROTATOR CUFF REPAIR  05/21/2011  . TONSILLECTOMY         Home Medications    Prior to Admission medications   Medication Sig Start Date End Date Taking? Authorizing Provider  citalopram (CELEXA) 20 MG tablet Take 20 mg by mouth daily.      Historical Provider, MD  Memantine HCl (NAMENDA PO) Take by mouth.    Historical Provider, MD  omeprazole (PRILOSEC) 20 MG capsule Take 20 mg by mouth daily.      Historical Provider, MD  zolpidem (AMBIEN) 5 MG tablet Take 5 mg by mouth at bedtime as needed for sleep.    Historical Provider, MD    Family History Family History  Problem Relation Age of Onset  . Breast cancer Mother   . Prostate cancer Father   . Leukemia Brother     Social History Social History  Substance Use Topics  . Smoking status: Never Smoker  . Smokeless tobacco: Never Used  . Alcohol use No     Allergies   Patient has no known allergies.   Review of Systems Review of Systems  Constitutional: Negative for chills and fever.  Gastrointestinal: Positive for abdominal pain, diarrhea and nausea. Negative for vomiting.  Genitourinary: Negative for dysuria, frequency and hematuria.  All other systems reviewed and are negative.   Physical Exam Updated Vital  Signs BP 114/89 (BP Location: Right Arm)   Pulse (!) 47   Resp 19   Ht 5\' 8"  (1.727 m)   Wt 73.9 kg   SpO2 100%   BMI 24.78 kg/m   Physical Exam  Constitutional: He is oriented to person, place, and time. He appears well-developed and well-nourished. No distress.  HENT:  Head: Normocephalic and atraumatic.  Eyes: Conjunctivae are normal. No scleral icterus.  Cardiovascular: Normal rate and regular rhythm.   No murmur heard. Pulmonary/Chest: Effort normal. No respiratory distress. He has no wheezes. He has no rales.  Abdominal: Soft. Bowel sounds are normal. He exhibits no distension. There is tenderness. There is rebound and guarding. No hernia.   Tenderness to palpation in epigastrum, LLQ and RLQ. Tenderness is worse in RLQ   Neurological: He is alert and oriented to person, place, and time.  Skin: He is diaphoretic.  Psychiatric: He has a normal mood and affect. His behavior is normal.    ED Treatments / Results  Labs (all labs ordered are listed, but only abnormal results are displayed) Labs Reviewed - No data to display  EKG  EKG Interpretation None       Radiology No results found.  Procedures Procedures (including critical care time)  Medications Ordered in ED Medications - No data to display   Initial Impression / Assessment and Plan / ED Course  I have reviewed the triage vital signs and the nursing notes.  Pertinent labs & imaging results that were available during my care of the patient were reviewed by me and considered in my medical decision making (see chart for details).    History and exam were concerning for abdominal aortic aneurysm. Performed bedside ultrasound which did not show obvious dilation of the aorta or gallbladder wall thickening. Gave 4 mg IV morphine and 1 L NS bolus. Alvarado score for appendicitis >4 ( anorexia, nausea, rebound, and tenderness to right lower quadrant.)   12:30 pm Patient reports improvement of his pain to 4/10 after IV morphine. CT abdomen and pelvis revealed fatty liver, and 5 mm non obstructing nephrolitiasis in the right kidney pole, sigmoid diverticulosis and aortic atherosclerosis. There was no abdominal aortic aneurysm or calcified gallstones.  1:40 EKG showed no ST segment changes or T wave abnormalities. Troponin <0.03 reassuring that this epigastric tenderness is less likely related to ACS.   Pain and diarrhea may be related to acute gastroenteritis. Discharged with plans for close follow up with PCP. Discussed return precautions.   Final Clinical Impressions(s) / ED Diagnoses   Final diagnoses:  None    New Prescriptions New Prescriptions   No  medications on file     Ledell Noss, MD 07/25/16 [place], MD 07/26/16 [phone]

## 2016-07-25 NOTE — ED Provider Notes (Signed)
[name]    CSN: [id number] Arrival date & time: 07/25/16  0805     History   Chief Complaint Chief Complaint  Patient presents with  . Abdominal Pain    HPI Jack Howard is a 81 y.o. male.   The history is provided by the patient and the spouse. No language interpreter was used.  Abdominal Pain  Pain location:  RLQ and RUQ Pain quality: dull, sharp and shooting   Pain radiates to:  RLQ Pain severity:  Severe Onset quality:  Gradual (Then awoke with severe pain 3 AM today) Duration:  1 day Timing:  Constant Progression:  Worsening Chronicity:  New Context: awakening from sleep and previous surgery (No previous abdominal surgery, except prostate cancer surgery in the past)   Context: not recent travel, not sick contacts, not suspicious food intake and not trauma   Relieved by:  Nothing Worsened by:  Palpation Ineffective treatments:  None tried Associated symptoms: nausea   Associated symptoms: no chest pain, no constipation, no dysuria, no fever, no hematochezia, no hematuria, no melena and no shortness of breath   Risk factors: being elderly     Past Medical History:  Diagnosis Date  . Anxiety   . GERD (gastroesophageal reflux disease)   . Insomnia   . Left rotator cuff tear 05/21/2011  . Malignant melanoma ([place])   . Prostate cancer [facility])     Patient Active Problem List   Diagnosis Date Noted  . Left rotator cuff tear 05/21/2011  . UNSPECIFIED SUDDEN HEARING LOSS 01/06/2011  . PROSTATE CANCER 04/15/2008  . HYPOGLYCEMIA 04/15/2008  . ANXIETY 04/15/2008  . BRADYCARDIA 04/15/2008  . ESOPHAGEAL REFLUX 04/15/2008  . INSOMNIA 04/15/2008  . COUGH 04/15/2008  . CHEST PAIN, RECURRENT 04/15/2008  . MALIGNANT MELANOMA, HX OF 04/15/2008  . BENIGN PROSTATIC HYPERTROPHY, HX OF 04/15/2008    Past Surgical History:  Procedure Laterality Date  . EYE SURGERY     both cataracts  . HEMORRHOID SURGERY    . KNEE SURGERY    . PROSTATE SURGERY      . ROTATOR CUFF REPAIR  05/21/2011  . TONSILLECTOMY         Home Medications    Prior to Admission medications   Medication Sig Start Date End Date Taking? Authorizing Provider  citalopram (CELEXA) 20 MG tablet Take 20 mg by mouth daily.     Yes Historical Provider, MD  Memantine HCl (NAMENDA PO) Take by mouth.   Yes Historical Provider, MD  omeprazole (PRILOSEC) 20 MG capsule Take 20 mg by mouth daily.     Yes Historical Provider, MD  zolpidem (AMBIEN) 5 MG tablet Take 5 mg by mouth at bedtime as needed for sleep.   Yes Historical Provider, MD    Family History Family History  Problem Relation Age of Onset  . Breast cancer Mother   . Prostate cancer Father   . Leukemia Brother     Social History Social History  Substance Use Topics  . Smoking status: Never Smoker  . Smokeless tobacco: Never Used  . Alcohol use No     Allergies   Patient has no known allergies.   Review of Systems Review of Systems  Constitutional: Negative for fever.  Respiratory: Negative for shortness of breath.   Cardiovascular: Negative for chest pain.  Gastrointestinal: Positive for abdominal pain and nausea. Negative for constipation, hematochezia and melena.  Genitourinary: Negative for dysuria and hematuria.  Neurological: Negative for syncope.  All other systems  reviewed and are negative.    Physical Exam Triage Vital Signs ED Triage Vitals  Enc Vitals Group     BP      Pulse      Resp      Temp      Temp src      SpO2      Weight      Height      Head Circumference      Peak Flow      Pain Score      Pain Loc      Pain Edu?      Excl. in [place]?    No data found.   Updated Vital Signs BP 99/63 (BP Location: Left Arm)   Pulse (!) 54   Temp 97.5 F (36.4 C) (Oral)   Resp 18   Wt 163 lb (73.9 kg)   SpO2 99%   BMI 24.78 kg/m    Physical Exam  Constitutional: He is oriented to person, place, and time. He appears well-developed and well-nourished. He appears  distressed.  Alert, cooperative male, in acute distress from right sided abdominal pain.  HENT:  Head: Normocephalic.  Mouth/Throat: Oropharynx is clear and moist.  Eyes: Pupils are equal, round, and reactive to light. No scleral icterus.  Neck: Neck supple. No JVD present.  Cardiovascular: Normal rate, regular rhythm and normal heart sounds.  Exam reveals no gallop and no friction rub.   No murmur heard. Pulmonary/Chest: Breath sounds normal.  Abdominal: He exhibits distension. There is tenderness (Right upper quadrant and right lower quadrant). There is rebound and guarding.  Lymphadenopathy:    He has no cervical adenopathy.  Neurological: He is alert and oriented to person, place, and time. No cranial nerve deficit.  Skin: Skin is warm and dry.  Psychiatric: He has a normal mood and affect.  Nursing note and vitals reviewed.    UC Treatments / Results  Labs (all labs ordered are listed, but only abnormal results are displayed) Labs Reviewed  POCT URINALYSIS DIP (MANUAL ENTRY) - Abnormal; Notable for the following:       Result Value   Blood, UA small (*)    All other components within normal limits  POCT CBC W AUTO DIFF ([facility])  Hemoglobin 14.2, WBC 7.2  EKG  EKG Interpretation None       Radiology No results found.  Procedures Procedures (including critical care time)  Medications Ordered in UC Medications - No data to display   Initial Impression / Assessment and Plan / UC Course  I have reviewed the triage vital signs and the nursing notes.  Pertinent labs & imaging results that were available during my care of the patient were reviewed by me and considered in my medical decision making (see chart for details).     Final Clinical Impressions(s) / UC Diagnoses   Final diagnoses:  Right lower quadrant abdominal pain  And right upper quadrant pain. Highly suspicious for acute abdomen, cholecystitis or acute appendicitis. Possible renal  colic, but clinical picture does not completely fit this dx EMS called, will transport to hospital emergency room, they prefer [facility] in [place] Prescriptions   No medications on file     Jacqulyn Cane, MD 07/25/16 [phone]

## 2016-07-25 NOTE — ED Triage Notes (Signed)
Patient c/o 1 1/2 days of RLQ abdominal pain with nausea. Denies vomiting, fever, chills sweats. Minimal diarrhea.

## 2016-07-26 ENCOUNTER — Telehealth: Payer: Self-pay | Admitting: Emergency Medicine

## 2016-07-26 ENCOUNTER — Telehealth: Payer: Self-pay | Admitting: *Deleted

## 2016-07-26 LAB — URINE CULTURE: Culture: NO GROWTH

## 2016-07-26 NOTE — Telephone Encounter (Signed)
Callback: No answer, LMOM f/u from visit. Call back as needed. 

## 2017-03-13 ENCOUNTER — Ambulatory Visit
Admission: RE | Admit: 2017-03-13 | Discharge: 2017-03-13 | Disposition: A | Discharge status: Home / Self Care | Payer: Medicare Other | Source: Ambulatory Visit | Attending: Family Medicine | Admitting: Family Medicine

## 2017-03-13 ENCOUNTER — Other Ambulatory Visit: Payer: Self-pay | Admitting: Family Medicine

## 2017-03-13 DIAGNOSIS — M25512 Pain in left shoulder: Secondary | ICD-10-CM

## 2017-03-13 DIAGNOSIS — M25511 Pain in right shoulder: Secondary | ICD-10-CM

## 2017-03-18 ENCOUNTER — Ambulatory Visit: Payer: Medicare Other | Admitting: Physical Therapy

## 2017-03-18 DIAGNOSIS — M25511 Pain in right shoulder: Secondary | ICD-10-CM

## 2017-03-18 DIAGNOSIS — G8929 Other chronic pain: Secondary | ICD-10-CM | POA: Diagnosis not present

## 2017-03-18 DIAGNOSIS — M6281 Muscle weakness (generalized): Secondary | ICD-10-CM | POA: Diagnosis not present

## 2017-03-18 DIAGNOSIS — R293 Abnormal posture: Secondary | ICD-10-CM | POA: Diagnosis not present

## 2017-03-18 DIAGNOSIS — M25512 Pain in left shoulder: Secondary | ICD-10-CM | POA: Diagnosis not present

## 2017-03-18 NOTE — Patient Instructions (Addendum)
Scapula Adduction With Pectorals, Low   Stand in doorframe with palms against frame and arms at 45. Lean forward and squeeze shoulder blades. Hold _30-45__ seconds. Repeat _2__ times per session. Do _1__ sessions per day.  Scapula Adduction With Pectorals, Mid-Range   Stand in doorframe with palms against frame and arms at 90. Step forward and squeeze shoulder blades. Hold _30-45__ seconds. Repeat _2__ times per session. Do _1__ sessions per day.  Strengthening: Resisted External Rotation - stand nice and tall, use towel under elbow.     Hold tubing in right hand, elbow at side and forearm across body. Rotate forearm out. Repeat __10__ times per set. Do __3__ sets per session. Do _1___ sessions per day. Repeat on other side.   Strengthening: Resisted Internal Rotation - stand tall, use small towel under your elbow .     Hold tubing in left hand, elbow at side and forearm out. Rotate forearm in across body. Repeat __10__ times per set. Do ___3_ sets per session. Do __1__ sessions per day. Repeat on the other side.    IONTOPHORESIS PATIENT PRECAUTIONS & CONTRAINDICATIONS:  . Redness under one or both electrodes can occur.  This characterized by a uniform redness that usually disappears within 12 hours of treatment. . Small pinhead size blisters may result in response to the drug.  Contact your physician if the problem persists more than 24 hours. . On rare occasions, iontophoresis therapy can result in temporary skin reactions such as rash, inflammation, irritation or burns.  The skin reactions may be the result of individual sensitivity to the ionic solution used, the condition of the skin at the start of treatment, reaction to the materials in the electrodes, allergies or sensitivity to dexamethasone, or a poor connection between the patch and your skin.  Discontinue using iontophoresis if you have any of these reactions and report to your therapist. . Remove the Patch or  electrodes if you have any undue sensation of pain or burning during the treatment and report discomfort to your therapist. . Tell your Therapist if you have had known adverse reactions to the application of electrical current. . If using the Patch, the LED light will turn off when treatment is complete and the patch can be removed.  Approximate treatment time is 1-3 hours.  Remove the patch when light goes off or after 6 hours. . The Patch can be worn during normal activity, however excessive motion where the electrodes have been placed can cause poor contact between the skin and the electrode or uneven electrical current resulting in greater risk of skin irritation. Marland Kitchen Keep out of the reach of children.   . DO NOT use if you have a cardiac pacemaker or any other electrically sensitive implanted device. . DO NOT use if you have a known sensitivity to dexamethasone. . DO NOT use during Magnetic Resonance Imaging (MRI). . DO NOT use over broken or compromised skin (e.g. sunburn, cuts, or acne) due to the increased risk of skin reaction. . DO NOT SHAVE over the area to be treated:  To establish good contact between the Patch and the skin, excessive hair may be clipped. . DO NOT place the Patch or electrodes on or over your eyes, directly over your heart, or brain. . DO NOT reuse the Patch or electrodes as this may cause burns to occur.

## 2017-03-18 NOTE — Therapy (Signed)
[name], [place], [address] Phone: [phone]   Fax:  [phone]  Physical Therapy Evaluation  Patient Details  Name: Jack Howard MRN: [id number] Date of Birth: 1936/04/09 Referring Provider: Dr Damaris Hippo   Encounter Date: 03/18/2017  PT End of Session - 03/18/17 1052    Visit Number  1    Number of Visits  12    Date for PT Re-Evaluation  04/29/17    PT Start Time  1053    PT Stop Time  1130    PT Time Calculation (min)  37 min    Activity Tolerance  Patient tolerated treatment well       Past Medical History:  Diagnosis Date  . Anxiety   . Dementia 07/04/2016   new diagnosis of early stages of alzheimers dementia 3 weeks ago   . GERD (gastroesophageal reflux disease)   . Insomnia   . Left rotator cuff tear 05/21/2011  . Malignant melanoma ([place])   . Prostate cancer [facility])     Past Surgical History:  Procedure Laterality Date  . EYE SURGERY     both cataracts  . HEMORRHOID SURGERY    . KNEE SURGERY    . PROSTATE SURGERY    . ROTATOR CUFF REPAIR  05/21/2011  . TONSILLECTOMY      There were no vitals filed for this visit.   Subjective Assessment - 03/18/17 1055    Subjective  Pt reports he fell down some stairs 3 weeks ago landing on his Rt side and this inflamed his Rt shoulder, he has a h/o bilat shoulder pain. Play golf atleast once a week and he has pain with this and move his arm and sleep.  Starting to drop items about 3 months ago    Pertinent History  new dx of beginning dementia , Lt RTC repair 2013    Diagnostic tests  x-ray (-) for fractures    Patient Stated Goals  play golf and all activities with his baseline pain.     Currently in Pain?  Yes    Pain Score  5     Pain Location  Shoulder    Pain Orientation  Right;Left    Pain Descriptors / Indicators  Dull    Pain Type  Acute pain    Pain Radiating Towards  both shoulders and neck    Pain Onset  1 to 4  weeks ago    Pain Frequency  Constant    Aggravating Factors   moving the arm and sleep    Pain Relieving Factors  nothing         [facility] PT Assessment - 03/18/17 0001      Assessment   Medical Diagnosis  Rt RTC tendonitis    Referring Provider  Dr Damaris Hippo    Onset Date/Surgical Date  02/25/17    Hand Dominance  Right    Next MD Visit  PRN    Prior Therapy  not for his shoulder      Precautions   Precautions  None      Balance Screen   Has the patient fallen in the past 6 months  Yes    How many times?  a couple    Has the patient had a decrease in activity level because of a fear of falling?   No    Is the patient reluctant to leave their home because of a fear of falling?   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      Observation/Other Assessments   Focus on Therapeutic Outcomes (FOTO)   33% limited      Posture/Postural Control   Posture/Postural Control  Postural limitations    Postural Limitations  Forward head;Rounded Shoulders      ROM / Strength   AROM / PROM / Strength  AROM;Strength      AROM   AROM Assessment Site  Shoulder;Elbow;Cervical    Right/Left Shoulder  Right    Right Shoulder Extension  65 Degrees    Right Shoulder Flexion  127 Degrees with pain    Right Shoulder ABduction  138 Degrees    Right Shoulder Internal Rotation  47 Degrees    Right Shoulder External Rotation  88 Degrees      Strength   Strength Assessment Site  Shoulder;Forearm    Right/Left Shoulder  Right Lt WNL    Right Shoulder Flexion  4/5    Right Shoulder Extension  5/5    Right Shoulder ABduction  4+/5    Right Shoulder Internal Rotation  -- 5-/5    Right Shoulder External Rotation  4-/5      Flexibility   Soft Tissue Assessment  /Muscle Length  -- tight pecs      Palpation   Palpation comment  point tender anterior Rt shoulder joint.              Objective measurements completed on examination: See above findings.      [facility] Adult PT Treatment/Exercise - 03/18/17 0001      Exercises   Exercises  Shoulder      Shoulder Exercises: Standing   External Rotation  Strengthening;Both;Theraband 30 reps, towel under elbow    Theraband Level (Shoulder External Rotation)  Level 2 (Red)    Internal Rotation  Strengthening;Both;Theraband 30 reps, towel under elbow    Theraband Level (Shoulder Internal Rotation)  Level 2 (Red)    Internal Rotation Limitations  VC for form      Shoulder Exercises: Stretch   Other Shoulder Stretches  low and mid doorway stretch x 30 sec      Modalities   Modalities  Iontophoresis      Iontophoresis   Type of Iontophoresis  Dexamethasone    Location  Rt shoulder acromion process area    Dose  1.0cc    Time  76mAp patch             PT Education - 03/18/17 1123    Education provided  Yes    Education Details  HEP    Person(s) Educated  Patient    Methods  Explanation;Demonstration;Handout    Comprehension  Returned demonstration;Verbalized understanding;Verbal cues required;Need further instruction review form          PT Long Term Goals - 03/18/17 1037      PT LONG TERM GOAL #1   Title  i with advanced HEP ( 04/29/17)     Time  6    Period  Weeks    Status  New      PT LONG TERM GOAL #2   Title  report =/> 75% reduction in Rt shoulder pain to allow him to return  to his normal sleep pattern ( 04/29/17)     Time  6    Period  Weeks    Status  New      PT LONG TERM GOAL #3   Title  improve FOTO =/<30 % limited, CJ level (04/29/17)     Time  6    Period  Weeks    Status  New      PT LONG TERM GOAL #4   Title  demo bilat shoulder strength =/> 5-/5 without pain ( 04/29/17)     Time  6    Period  Weeks    Status  New      PT LONG TERM GOAL #5    Title  play golf per his previous level of shoulder discomfort ( 04/29/17)     Time  6    Period  Weeks    Status  New             Plan - March 28, 2017 1132    Clinical Impression Statement  81 yo male ~ 3 wks a fall on the stairs landing on his rt side and sustaining a Rt shoulder injury.  He reports he has a long h/o bilat shoulder pain that has been getting progressively worse.  The fall has really irritated the Rt side.  He is able to play golf at least once a week however he has pain with all motion of the Rt UE and some on the left.  He is also waking multple times at night due to shoulder pain.  He has limited Rt shoulder ROM and strength along with some postural changes. Point tender at the end of the Rt acromion process.  He reports he has initial onset dementia and is having trouble with his short term memory.     History and Personal Factors relevant to plan of care:  Lt RTC repair 2013    Clinical Presentation  Stable    Clinical Decision Making  Low    Rehab Potential  Good    PT Frequency  2x / week    PT Duration  6 weeks    PT Treatment/Interventions  Iontophoresis 4mg /ml Dexamethasone;Dry needling;Manual techniques;Moist Heat;Ultrasound;Therapeutic activities;Patient/family education;Taping;Therapeutic exercise;Cryotherapy;Electrical Stimulation;Passive range of motion    PT Next Visit Plan  progress scapular stabilization and RTC strengthening bilat, ionto patch to Rt shoulder. possible ultrasound.     Consulted and Agree with Plan of Care  Patient       Patient will benefit from skilled therapeutic intervention in order to improve the following deficits and impairments:  Pain, Improper body mechanics, Postural dysfunction, Decreased range of motion, Decreased strength, Impaired UE functional use  Visit Diagnosis: Acute pain of right shoulder - Plan: PT plan of care cert/re-cert  Muscle weakness (generalized) - Plan: PT plan of care cert/re-cert  Abnormal posture -  Plan: PT plan of care cert/re-cert  Chronic left shoulder pain - Plan: PT plan of care cert/re-cert  [facility] PT PB G-CODES - 03/28/2017 1140    Functional Assessment Tool Used   FOTO and professional judgement    Functional Limitations  Carrying, moving and handling objects    Carrying, Moving and Handling Objects Current Status (Y0998)  At least 40 percent but less than 60 percent impaired, limited or restricted    Carrying, Moving and Handling Objects Goal Status (P3825)  At least 20 percent but less than 40 percent impaired, limited or restricted        Problem List Patient Active  Problem List   Diagnosis Date Noted  . Left rotator cuff tear 05/21/2011  . UNSPECIFIED SUDDEN HEARING LOSS 01/06/2011  . PROSTATE CANCER 04/15/2008  . HYPOGLYCEMIA 04/15/2008  . ANXIETY 04/15/2008  . BRADYCARDIA 04/15/2008  . ESOPHAGEAL REFLUX 04/15/2008  . INSOMNIA 04/15/2008  . COUGH 04/15/2008  . CHEST PAIN, RECURRENT 04/15/2008  . MALIGNANT MELANOMA, HX OF 04/15/2008  . BENIGN PROSTATIC HYPERTROPHY, HX OF 04/15/2008    Jeral Pinch PT  03/18/2017, 11:44 AM  [facility] [address] [place], [place], [address] Phone: [phone]   Fax:  [phone]  Name: GUERIN LASHOMB MRN: [id number] Date of Birth: 19-Feb-1936

## 2017-03-21 ENCOUNTER — Encounter: Payer: Medicare Other | Admitting: Physical Therapy

## 2017-03-25 ENCOUNTER — Ambulatory Visit: Payer: Medicare Other | Admitting: Physical Therapy

## 2017-03-25 DIAGNOSIS — G8929 Other chronic pain: Secondary | ICD-10-CM | POA: Diagnosis not present

## 2017-03-25 DIAGNOSIS — M6281 Muscle weakness (generalized): Secondary | ICD-10-CM | POA: Diagnosis not present

## 2017-03-25 DIAGNOSIS — M25511 Pain in right shoulder: Secondary | ICD-10-CM | POA: Diagnosis not present

## 2017-03-25 DIAGNOSIS — R293 Abnormal posture: Secondary | ICD-10-CM

## 2017-03-25 DIAGNOSIS — M25512 Pain in left shoulder: Secondary | ICD-10-CM | POA: Diagnosis not present

## 2017-03-25 NOTE — Patient Instructions (Signed)
Cane Exercise: Flexion    Lie on back, holding cane above chest. Keeping arms as straight as possible, lower cane toward floor beyond head. Hold __5__ seconds. Repeat _10___ times.  You can do this in bed before getting up in morning.   Cane Exercise: Abduction / Adduction    Hold cane palms down. Keeping back flat, move cane side to side over chest.  Repeat __10__ times. Do _1___ sessions per day.  Resistive Band Rowing   With resistive band anchored in door, grasp end of band. Keeping elbow bent, pull back, squeezing shoulder blades together. Hold _2-3___ seconds. Repeat _10-30___ times. Do __1__ sessions per day.  Cane Exercise: Extension    Stand holding cane behind back with both hands palm-up. Lift the cane away from body. Repeat __10__ times. Do __1-2__ sessions per day.  [facility] [address] [place], [place] [address]  [phone] (office) [phone] (fax)

## 2017-03-25 NOTE — Therapy (Signed)
[place], [place], [address] Phone: [phone]   Fax:  [phone]  Physical Therapy Treatment  Patient Details  Name: Jack Howard MRN: [id number] Date of Birth: [birth year] Referring Provider: Dr. Damaris Hippo   Encounter Date: 03/25/2017  PT End of Session - 03/25/17 1151    Visit Number  2    Number of Visits  12    Date for PT Re-Evaluation  04/29/17    PT Start Time  3419    PT Stop Time  3790 MHP last 12 min    PT Time Calculation (min)  59 min       Past Medical History:  Diagnosis Date  . Anxiety   . Dementia 07/04/2016   new diagnosis of early stages of alzheimers dementia 3 weeks ago   . GERD (gastroesophageal reflux disease)   . Insomnia   . Left rotator cuff tear 05/21/2011  . Malignant melanoma ([place])   . Prostate cancer [facility])     Past Surgical History:  Procedure Laterality Date  . EYE SURGERY     both cataracts  . HEMORRHOID SURGERY    . KNEE SURGERY    . PROSTATE SURGERY    . ROTATOR CUFF REPAIR  05/21/2011  . TONSILLECTOMY      There were no vitals filed for this visit.  Subjective Assessment - 03/25/17 1151    Subjective  Pt reports he had an injection week and 1/2 ago. He did not perform any exercises for 1 wk afterward.  He had relief for 2 days, but pain has returned.  He continues to golf 1-2x/wk. He woke up with burning pain across low back; resolved with ice pack application.     Patient Stated Goals  play golf and all activities with his baseline pain.     Currently in Pain?  Yes    Pain Score  7     Pain Location  Shoulder    Pain Orientation  Right;Left    Pain Descriptors / Indicators  Aching;Numbness;Dull    Pain Radiating Towards  both shoulders to the elbows    Aggravating Factors   moving arm, sleep    Pain Relieving Factors  nothing.          [facility] PT Assessment - 03/25/17 0001      Assessment   Medical Diagnosis  Rt RTC tendonitis     Referring Provider  Dr. Damaris Hippo    Onset Date/Surgical Date  02/25/17    Hand Dominance  Right    Next MD Visit  PRN       [facility] Adult PT Treatment/Exercise - 03/25/17 0001      Shoulder Exercises: Standing   External Rotation  Strengthening;Both;Theraband;20 reps towel under elbow, demo and VC for technique    Theraband Level (Shoulder External Rotation)  Level 2 (Red)    Internal Rotation  Strengthening;Both;Theraband towel under elbow    Theraband Level (Shoulder Internal Rotation)  Level 2 (Red)    Row  Both;20 reps;Theraband, with same side step back   Theraband Level (Shoulder Row)  Level 2 (Red)      Shoulder Exercises: Stretch   Other Shoulder Stretches  low and mid doorway stretch x 30 sec x 2 reps each position (VC for technique). Trial of unilateral midlevel doorway stretch x 20 sec each side.     Other Shoulder Stretches  Shoulder ext stretch (hand on door frame)x 30 sec x 2 reps  each arm;  shoulder dynamic stretches with cane - ext x 10, flexion with side to side motion.  Supine cane bilat flexion x 12 reps to tolerance, horiz abd/add x 10 (per HEP) .  Single knee to chest x 15 sec each side    UBE L1: 1.5 min forward/ 1.5 min backward     Modalities   Modalities  Moist Heat      Moist Heat Therapy   Number Minutes Moist Heat  12 Minutes    Moist Heat Location  Lumbar Spine;Shoulder Rt shoulder      Manual Therapy   Manual Therapy  Myofascial release;Soft tissue mobilization    Soft tissue mobilization  STM to bilat lat, pec, deltoids     Myofascial Release  bilat pec release.              PT Education - 03/25/17 1240    Education provided  Yes    Education Details  HEP, pt issued stress ball to work on grip strength.     Person(s) Educated  Patient    Methods  Explanation;Demonstration;Verbal cues;Tactile cues;Handout    Comprehension  Verbalized understanding          PT Long Term Goals - 03/25/17 1156      PT LONG TERM GOAL #1   Title   i with advanced HEP ( 04/29/17)     Time  6    Period  Weeks    Status  On-going      PT LONG TERM GOAL #2   Title  report =/> 75% reduction in Rt shoulder pain to allow him to return to his normal sleep pattern ( 04/29/17)     Time  6    Period  Weeks    Status  On-going      PT LONG TERM GOAL #3   Title  improve FOTO =/<30 % limited, CJ level (04/29/17)     Time  6    Period  Weeks    Status  On-going      PT LONG TERM GOAL #4   Title  demo bilat shoulder strength =/> 5-/5 without pain ( 04/29/17)     Time  6    Period  Weeks    Status  On-going      PT LONG TERM GOAL #5   Title  play golf per his previous level of shoulder discomfort ( 04/29/17)     Time  6    Period  Weeks    Status  On-going            Plan - 03/25/17 1215    Clinical Impression Statement  Pt required frequent cues for good form and posture with exercise. Pt tolerated all exercises with min increase in pain, except doorway stretch - this increased pain in shoulders significantly.  Pt had spasming of Rt/Lt lat with manual therapy to shoulder in supine; reduced with MHP at end of session and gentle stretches.  Pt reported reduced pain to 5/10 with exercise, and further reduction of pain by end of session.  Progressing towards goals.     Rehab Potential  Good    PT Frequency  2x / week    PT Duration  6 weeks    PT Treatment/Interventions  Iontophoresis 4mg /ml Dexamethasone;Dry needling;Manual techniques;Moist Heat;Ultrasound;Therapeutic activities;Patient/family education;Taping;Therapeutic exercise;Cryotherapy;Electrical Stimulation;Passive range of motion    PT Next Visit Plan  assess response to additional HEP exercises.  Manual therapy to shoulders and lats.  Consulted and Agree with Plan of Care  Patient       Patient will benefit from skilled therapeutic intervention in order to improve the following deficits and impairments:  Pain, Improper body mechanics, Postural dysfunction, Decreased  range of motion, Decreased strength, Impaired UE functional use  Visit Diagnosis: Acute pain of right shoulder  Muscle weakness (generalized)  Abnormal posture  Chronic left shoulder pain     Problem List Patient Active Problem List   Diagnosis Date Noted  . Left rotator cuff tear 05/21/2011  . UNSPECIFIED SUDDEN HEARING LOSS 01/06/2011  . PROSTATE CANCER 04/15/2008  . HYPOGLYCEMIA 04/15/2008  . ANXIETY 04/15/2008  . BRADYCARDIA 04/15/2008  . ESOPHAGEAL REFLUX 04/15/2008  . INSOMNIA 04/15/2008  . COUGH 04/15/2008  . CHEST PAIN, RECURRENT 04/15/2008  . MALIGNANT MELANOMA, HX OF 04/15/2008  . BENIGN PROSTATIC HYPERTROPHY, HX OF 04/15/2008   Kerin Perna, PTA 03/25/17 1:05 PM   [place], [place], [address] Phone: [phone]   Fax:  [phone]  Name: Jack Howard MRN: [id number] Date of Birth: 07-24-1935

## 2017-04-01 ENCOUNTER — Ambulatory Visit: Payer: Medicare Other | Admitting: Physical Therapy

## 2017-04-01 DIAGNOSIS — M25511 Pain in right shoulder: Secondary | ICD-10-CM

## 2017-04-01 DIAGNOSIS — G8929 Other chronic pain: Secondary | ICD-10-CM

## 2017-04-01 DIAGNOSIS — M25512 Pain in left shoulder: Secondary | ICD-10-CM

## 2017-04-01 DIAGNOSIS — M6281 Muscle weakness (generalized): Secondary | ICD-10-CM

## 2017-04-01 DIAGNOSIS — R293 Abnormal posture: Secondary | ICD-10-CM | POA: Diagnosis not present

## 2017-04-01 NOTE — Therapy (Signed)
[place], [place], [address] Phone: [phone]   Fax:  [phone]  Physical Therapy Treatment  Patient Details  Name: Jack Howard MRN: [id number] Date of Birth: 1935/10/19 Referring Provider: Dr. Damaris Hippo   Encounter Date: 04/01/2017  PT End of Session - 04/01/17 0941    Visit Number  3    Number of Visits  12    Date for PT Re-Evaluation  04/29/17    PT Start Time  0934    PT Stop Time  1017    PT Time Calculation (min)  43 min    Behavior During Therapy  [facility] for tasks assessed/performed       Past Medical History:  Diagnosis Date  . Anxiety   . Dementia 07/04/2016   new diagnosis of early stages of alzheimers dementia 3 weeks ago   . GERD (gastroesophageal reflux disease)   . Insomnia   . Left rotator cuff tear 05/21/2011  . Malignant melanoma ([place])   . Prostate cancer [facility])     Past Surgical History:  Procedure Laterality Date  . EYE SURGERY     both cataracts  . HEMORRHOID SURGERY    . KNEE SURGERY    . PROSTATE SURGERY    . ROTATOR CUFF REPAIR  05/21/2011  . TONSILLECTOMY      There were no vitals filed for this visit.  Subjective Assessment - 04/01/17 0942    Subjective  "I was doing really well until this morning. I woke up with it aching".  Pt complains of leg cramps at night.  He has not golfed due to weather and the holiday.     Currently in Pain?  Yes    Pain Score  5     Pain Location  Shoulder    Pain Orientation  Right    Pain Descriptors / Indicators  Aching    Aggravating Factors   sleep    Pain Relieving Factors  massage         [facility] PT Assessment - 04/01/17 0001      Assessment   Medical Diagnosis  Rt RTC tendonitis    Referring Provider  Dr. Damaris Hippo    Onset Date/Surgical Date  02/25/17    Hand Dominance  Right    Next MD Visit  PRN      AROM   Right Shoulder Flexion  140 Degrees       [facility] Adult PT Treatment/Exercise -  04/01/17 0001      Self-Care   Self-Care  Other Self-Care Comments    Other Self-Care Comments   Pt educated on self massage to pecs with ball; pt verbalized understanding and returned demo.       Lumbar Exercises: Stretches   Single Knee to Chest Stretch  2 reps;20 seconds    Lower Trunk Rotation  60 seconds gently back and forth, with arms in T      Shoulder Exercises: Supine   Horizontal ABduction  10 reps AAROM with cane -with ADD/ABD    External Rotation  Strengthening;Both;10 reps;Theraband 2 sets    Theraband Level (Shoulder External Rotation)  Level 2 (Red)    Flexion  AAROM;Both;10 reps cane, VC to slow down    Other Supine Exercises  overhead pull, horiz abdct, sash - with red band each x 10 reps.        Shoulder Exercises: Standing   Extension  AAROM;Both;10 reps cane behind back  Shoulder Exercises: ROM/Strengthening   UBE (Upper Arm Bike)  L1: 1 min each direction x 2 min  (standing)      Shoulder Exercises: Stretch   Other Shoulder Stretches  low and mid doorway stretch x 30 sec x 2 reps each position (VC for technique).  unilateral mid level doorway stretch x 30 sec each side, 2 reps each       Modalities   Modalities  Moist Heat pt declined modalities at end of session      Moist Heat Therapy   Number Minutes Moist Heat  10 Minutes    Moist Heat Location  Lumbar Spine during manual therapy      Manual Therapy   Soft tissue mobilization  STM to bilat lat, pec, deltoids. STM to Lt lat and thoracic paraspinals.     Myofascial Release  bilat pec release.              PT Education - 04/01/17 0959    Education Details  HEP     Person(s) Educated  Patient    Methods  Explanation;Demonstration;Tactile cues;Verbal cues;Handout    Comprehension  Returned demonstration;Verbalized understanding          PT Long Term Goals - 03/25/17 1156      PT LONG TERM GOAL #1   Title  i with advanced HEP ( 04/29/17)     Time  6    Period  Weeks    Status   On-going      PT LONG TERM GOAL #2   Title  report =/> 75% reduction in Rt shoulder pain to allow him to return to his normal sleep pattern ( 04/29/17)     Time  6    Period  Weeks    Status  On-going      PT LONG TERM GOAL #3   Title  improve FOTO =/<30 % limited, CJ level (04/29/17)     Time  6    Period  Weeks    Status  On-going      PT LONG TERM GOAL #4   Title  demo bilat shoulder strength =/> 5-/5 without pain ( 04/29/17)     Time  6    Period  Weeks    Status  On-going      PT LONG TERM GOAL #5   Title  play golf per his previous level of shoulder discomfort ( 04/29/17)     Time  6    Period  Weeks    Status  On-going            Plan - 04/01/17 1156    Clinical Impression Statement  Tal demonstrated improved Rt shoulder flexion ROM, with less pain.  Pt had positive response to last visit, reporting overall decrease in pain level.  Pt reported reduction of Rt shoulder pain by 2 points with stretches and further reduction by 2 points with manual therapy.  Pt declined modalities at end of session as he had another appt folllowing session.  Pt progressing towards goals.     Rehab Potential  Good    PT Frequency  2x / week    PT Duration  6 weeks    PT Treatment/Interventions  Iontophoresis 4mg /ml Dexamethasone;Dry needling;Manual techniques;Moist Heat;Ultrasound;Therapeutic activities;Patient/family education;Taping;Therapeutic exercise;Cryotherapy;Electrical Stimulation;Passive range of motion    PT Next Visit Plan  assess response to additional HEP exercises.  Manual therapy to shoulders and lats.      Consulted and Agree with Plan of Care  Patient       Patient will benefit from skilled therapeutic intervention in order to improve the following deficits and impairments:  Pain, Improper body mechanics, Postural dysfunction, Decreased range of motion, Decreased strength, Impaired UE functional use  Visit Diagnosis: Acute pain of right shoulder  Muscle weakness  (generalized)  Abnormal posture  Chronic left shoulder pain     Problem List Patient Active Problem List   Diagnosis Date Noted  . Left rotator cuff tear 05/21/2011  . UNSPECIFIED SUDDEN HEARING LOSS 01/06/2011  . PROSTATE CANCER 04/15/2008  . HYPOGLYCEMIA 04/15/2008  . ANXIETY 04/15/2008  . BRADYCARDIA 04/15/2008  . ESOPHAGEAL REFLUX 04/15/2008  . INSOMNIA 04/15/2008  . COUGH 04/15/2008  . CHEST PAIN, RECURRENT 04/15/2008  . MALIGNANT MELANOMA, HX OF 04/15/2008  . BENIGN PROSTATIC HYPERTROPHY, HX OF 04/15/2008   Kerin Perna, PTA 04/01/17 12:00 PM  [place], [place], [address] Phone: [phone]   Fax:  [phone]  Name: Jack Howard MRN: [id number] Date of Birth: Dec 10, 1935

## 2017-04-01 NOTE — Patient Instructions (Signed)
Over Head Pull: Narrow Grip        On back, knees bent, feet flat, band across thighs, elbows straight but relaxed. Pull hands apart (start). Keeping elbows straight, bring arms up and over head, hands toward floor. Keep pull steady on band. Hold momentarily. Return slowly, keeping pull steady, back to start. Repeat _10__ times. Band color _red_____  "touch down"  Side Pull: Double Arm   On back, knees bent, feet flat. Arms perpendicular to body, shoulder level, elbows straight but relaxed. Pull arms out to sides, elbows straight. Resistance band comes across collarbones, hands toward floor. Hold momentarily. Slowly return to starting position. Repeat _10_ times. Band color __red___  PALMS DOWN.   Sash   On back, knees bent, feet flat, left hand on left hip, right hand above left. Pull right arm DIAGONALLY (hip to shoulder) across chest. Bring right arm along head toward floor. Hold momentarily. Slowly return to starting position. Repeat _10__ times. Do with left arm. Band color ___red___  Garen Lah UP.   Shoulder Rotation: Double Arm   On back, knees bent, feet flat, elbows tucked at sides, bent 90, hands palms up. Pull hands apart and down toward floor, keeping elbows near sides. Hold momentarily. Slowly return to starting position. Repeat _10__ times. Band color __red____  PALMS UP.    [facility] [place], [place] [address]  [phone] (office) [phone] (fax)

## 2017-04-04 ENCOUNTER — Ambulatory Visit: Payer: Medicare Other | Admitting: Physical Therapy

## 2017-04-04 ENCOUNTER — Encounter: Payer: Self-pay | Admitting: Physical Therapy

## 2017-04-04 DIAGNOSIS — M6281 Muscle weakness (generalized): Secondary | ICD-10-CM

## 2017-04-04 DIAGNOSIS — M25511 Pain in right shoulder: Secondary | ICD-10-CM | POA: Diagnosis not present

## 2017-04-04 DIAGNOSIS — R293 Abnormal posture: Secondary | ICD-10-CM | POA: Diagnosis not present

## 2017-04-04 NOTE — Therapy (Signed)
[place], [place], [address] Phone: [phone]   Fax:  [phone]  Physical Therapy Treatment  Patient Details  Name: Jack Howard MRN: [id number] Date of Birth: 01-26-1936 Referring Provider: Dr. Damaris Hippo   Encounter Date: 04/04/2017  PT End of Session - 04/04/17 0938    Visit Number  4    Number of Visits  12    Date for PT Re-Evaluation  04/29/17    PT Start Time  0933    PT Stop Time  1015    PT Time Calculation (min)  42 min    Activity Tolerance  Patient tolerated treatment well;No increased pain    Behavior During Therapy  WFL for tasks assessed/performed       Past Medical History:  Diagnosis Date  . Anxiety   . Dementia 07/04/2016   new diagnosis of early stages of alzheimers dementia 3 weeks ago   . GERD (gastroesophageal reflux disease)   . Insomnia   . Left rotator cuff tear 05/21/2011  . Malignant melanoma ([place])   . Prostate cancer [facility])     Past Surgical History:  Procedure Laterality Date  . EYE SURGERY     both cataracts  . HEMORRHOID SURGERY    . KNEE SURGERY    . PROSTATE SURGERY    . ROTATOR CUFF REPAIR  05/21/2011  . TONSILLECTOMY      There were no vitals filed for this visit.  Subjective Assessment - 04/04/17 0938    Subjective  Pt reports 50% improvement since starting therapy.  His knees are achy today.  He still has occasions of increased pain in his Rt shoulder, but things are improving.     Pertinent History  new dx of beginning dementia , Lt RTC repair 2013    Patient Stated Goals  play golf and all activities with his baseline pain.     Currently in Pain?  Yes    Pain Score  2     Pain Location  Shoulder    Pain Orientation  Right    Pain Descriptors / Indicators  Aching;Dull    Aggravating Factors   ??    Pain Relieving Factors  massage         [facility] PT Assessment - 04/04/17 0001      Assessment   Medical Diagnosis  Rt RTC  tendonitis    Referring Provider  Dr. Damaris Hippo    Onset Date/Surgical Date  02/25/17    Hand Dominance  Right    Next MD Visit  PRN      Strength   Right/Left Shoulder  Right    Right Shoulder Flexion  4+/5        [facility] Adult PT Treatment/Exercise - 04/04/17 0001      Shoulder Exercises: Supine   Horizontal ABduction  Strengthening;Both;10 reps;Theraband    Theraband Level (Shoulder Horizontal ABduction)  Level 3 (Green)    External Rotation  Strengthening;Both;10 reps;Theraband    Theraband Level (Shoulder External Rotation)  Level 3 (Green)    Other Supine Exercises  overhead pull, sash - with green band each x 10 reps.        Shoulder Exercises: Standing   External Rotation  Strengthening;Both;Theraband;20 reps towel under elbow, demo and VC for technique    Theraband Level (Shoulder External Rotation)  Level 2 (Red)    Internal Rotation  Strengthening;Both;Theraband towel under elbow    Theraband Level (Shoulder Internal Rotation)  Level 2 (Red)    Row  Strengthening;Both;10 reps;Theraband 2 sets    Theraband Level (Shoulder Row)  Level 3 (Green)    Other Standing Exercises  Resisted Rt horiz add with red band, simulating golf swing x 10 reps; resisted Rt horiz abd with red band x 12 reps      Shoulder Exercises: ROM/Strengthening   UBE (Upper Arm Bike)  L1: 1.5 min each direction x 2 min  (standing)      Shoulder Exercises: Stretch   Other Shoulder Stretches  low and mid doorway stretch x 30 sec x 2 reps each position (VC for technique).      Other Shoulder Stretches  shoulder ext stretch with cane behind back (dynamic) x 10 reps       Manual Therapy   Soft tissue mobilization  STM to Rt lat, pec, deltoid.    Myofascial Release  Rt pec release.              PT Education - 04/04/17 0959    Education provided  Yes    Education Details  Pt issued green band for existing supine exercises.     Person(s) Educated  Patient    Methods  Explanation     Comprehension  Verbalized understanding          PT Long Term Goals - 04/04/17 0943      PT LONG TERM GOAL #1   Title  i with advanced HEP ( 04/29/17)     Time  6    Period  Weeks    Status  On-going      PT LONG TERM GOAL #2   Title  report =/> 75% reduction in Rt shoulder pain to allow him to return to his normal sleep pattern ( 04/29/17)     Time  6    Period  Weeks    Status  On-going      PT LONG TERM GOAL #3   Title  improve FOTO =/<30 % limited, CJ level (04/29/17)     Time  6    Period  Weeks    Status  On-going      PT LONG TERM GOAL #4   Title  demo bilat shoulder strength =/> 5-/5 without pain ( 04/29/17)     Status  On-going      PT LONG TERM GOAL #5   Title  play golf per his previous level of shoulder discomfort ( 04/29/17)     Time  6    Period  Weeks    Status  On-going            Plan - 04/04/17 1000    Clinical Impression Statement  Pt's now reporting 50% reduction in symptoms.  His Rt shoulder flexion strength has improved.  He tolerated exercises with increased resistance without any increase in symptoms.  Less palpable tightness in Rt lat and pec with manual therapy.  Pt progressing towards goals.     Rehab Potential  Good    PT Frequency  2x / week    PT Duration  6 weeks    PT Treatment/Interventions  Iontophoresis 4mg /ml Dexamethasone;Dry needling;Manual techniques;Moist Heat;Ultrasound;Therapeutic activities;Patient/family education;Taping;Therapeutic exercise;Cryotherapy;Electrical Stimulation;Passive range of motion    PT Next Visit Plan  assess response to increased resistance for  HEP exercises.  Manual therapy to shoulders and lats as indicated.      Consulted and Agree with Plan of Care  Patient       Patient will  benefit from skilled therapeutic intervention in order to improve the following deficits and impairments:  Pain, Improper body mechanics, Postural dysfunction, Decreased range of motion, Decreased strength, Impaired UE  functional use  Visit Diagnosis: Acute pain of right shoulder  Muscle weakness (generalized)  Abnormal posture     Problem List Patient Active Problem List   Diagnosis Date Noted  . Left rotator cuff tear 05/21/2011  . UNSPECIFIED SUDDEN HEARING LOSS 01/06/2011  . PROSTATE CANCER 04/15/2008  . HYPOGLYCEMIA 04/15/2008  . ANXIETY 04/15/2008  . BRADYCARDIA 04/15/2008  . ESOPHAGEAL REFLUX 04/15/2008  . INSOMNIA 04/15/2008  . COUGH 04/15/2008  . CHEST PAIN, RECURRENT 04/15/2008  . MALIGNANT MELANOMA, HX OF 04/15/2008  . BENIGN PROSTATIC HYPERTROPHY, HX OF 04/15/2008   This entire session was performed under direct supervision and direction of a licensed physical therapist assistant. I have personally read, edited and approve of the note as written.  Kerin Perna, PTA 04/04/17 10:22 AM  [place], [place], [address] Phone: [phone]   Fax:  [phone]  Name: Jack Howard MRN: [id number] Date of Birth: 1935-10-11

## 2017-04-08 ENCOUNTER — Encounter: Payer: Self-pay | Admitting: Physical Therapy

## 2017-04-08 ENCOUNTER — Ambulatory Visit: Payer: Medicare Other | Admitting: Physical Therapy

## 2017-04-08 DIAGNOSIS — M25512 Pain in left shoulder: Secondary | ICD-10-CM

## 2017-04-08 DIAGNOSIS — M25511 Pain in right shoulder: Secondary | ICD-10-CM

## 2017-04-08 DIAGNOSIS — G8929 Other chronic pain: Secondary | ICD-10-CM

## 2017-04-08 DIAGNOSIS — M6281 Muscle weakness (generalized): Secondary | ICD-10-CM | POA: Diagnosis not present

## 2017-04-08 DIAGNOSIS — R293 Abnormal posture: Secondary | ICD-10-CM

## 2017-04-08 NOTE — Therapy (Signed)
[place], [place], [address] Phone: [phone]   Fax:  [phone]  Physical Therapy Treatment  Patient Details  Name: Jack Howard MRN: [id number] Date of Birth: [birth year] Referring Provider: Dr. Damaris Hippo   Encounter Date: 04/08/2017  PT End of Session - 04/08/17 0921    Visit Number  5    Number of Visits  12    Date for PT Re-Evaluation  04/29/17    PT Start Time  0925    PT Stop Time  1019 heat at end    PT Time Calculation (min)  54 min    Activity Tolerance  Patient tolerated treatment well       Past Medical History:  Diagnosis Date  . Anxiety   . Dementia 07/04/2016   new diagnosis of early stages of alzheimers dementia 3 weeks ago   . GERD (gastroesophageal reflux disease)   . Insomnia   . Left rotator cuff tear 05/21/2011  . Malignant melanoma ([place])   . Prostate cancer [facility])     Past Surgical History:  Procedure Laterality Date  . EYE SURGERY     both cataracts  . HEMORRHOID SURGERY    . KNEE SURGERY    . PROSTATE SURGERY    . ROTATOR CUFF REPAIR  05/21/2011  . TONSILLECTOMY      There were no vitals filed for this visit.  Subjective Assessment - 04/08/17 0925    Subjective  Shoulder is feeling better overall, did have some pain last night in bed, lying on the Rt side.  Today his low back is very sore.  Has a new 58 month old puppy and is bending over alot with it.     Patient Stated Goals  play golf and all activities with his baseline pain.     Currently in Pain?  -- only having pain pain in his low back.          [facility] PT Assessment - 04/08/17 0001      Assessment   Medical Diagnosis  Rt RTC tendonitis      AROM   Right Shoulder Extension  83 Degrees    Right Shoulder Flexion  148 Degrees    Right Shoulder ABduction  165 Degrees    Right Shoulder Internal Rotation  72 Degrees    Right Shoulder External Rotation  82 Degrees      Strength   Right/Left Shoulder  Right no pain with resistance.     Right Shoulder Flexion  5/5    Right Shoulder Extension  5/5    Right Shoulder ABduction  5/5    Right Shoulder Internal Rotation  5/5    Right Shoulder External Rotation  -- 5-/5                  [facility] Adult PT Treatment/Exercise - 04/08/17 0001      Lumbar Exercises: Stretches   Single Knee to Chest Stretch  2 reps;20 seconds    Lower Trunk Rotation  30 seconds    ITB Stretch  -- 3x20 sec childs pose    Piriformis Stretch  -- 10x5 sec isometric hip flex pusing into hands.       Shoulder Exercises: Supine   Horizontal ABduction  Strengthening;Both;Theraband 3x10 reps, 2 of those while bridging.     Theraband Level (Shoulder Horizontal ABduction)  Level 3 (Green)      Shoulder Exercises: Prone   Other Prone Exercises  3x10  t's, 2 sets with upper body lifts.       Shoulder Exercises: Sidelying   External Rotation  Strengthening;Both;Weights 3x10    External Rotation Weight (lbs)  3 with towel under elbow      Shoulder Exercises: ROM/Strengthening   UBE (Upper Arm Bike)  L2x 4' alt FWD BWD      Shoulder Exercises: Stretch   Wall Stretch - Flexion  -- supine with strap around wrists.     Other Shoulder Stretches  low and mid doorway stretch x 30 sec x 2 reps each position (VC for technique).      Other Shoulder Stretches  bilat shoulder stretches with strap in doorway.       Moist Heat Therapy   Number Minutes Moist Heat  15 Minutes    Moist Heat Location  Lumbar Spine                  PT Long Term Goals - 04/08/17 1610      PT LONG TERM GOAL #1   Title  i with advanced HEP ( 04/29/17)     Status  On-going      PT LONG TERM GOAL #2   Title  report =/> 75% reduction in Rt shoulder pain to allow him to return to his normal sleep pattern ( 04/29/17)     Status  Achieved 75% improvement per patient      PT LONG TERM GOAL #3   Title  improve FOTO =/<30 % limited, CJ level (04/29/17)     Status   On-going      PT LONG TERM GOAL #4   Title  demo bilat shoulder strength =/> 5-/5 without pain ( 04/29/17)     Status  Achieved      PT LONG TERM GOAL #5   Title  play golf per his previous level of shoulder discomfort ( 04/29/17)     Status  On-going            Plan - 04/08/17 0942    Clinical Impression Statement  Pt is doing well in regards to his Rt shoulder, he has partially met his goals, pain reduction and strength.  He does still have some pain in the Rt shoulder.  Today his back is flared up so some exercise for this was incorporated into todays sesscion.  He is on track for finishing up therapy sooner than POC indicated    Rehab Potential  Good    PT Frequency  2x / week    PT Duration  6 weeks    PT Treatment/Interventions  Iontophoresis 95m/ml Dexamethasone;Dry needling;Manual techniques;Moist Heat;Ultrasound;Therapeutic activities;Patient/family education;Taping;Therapeutic exercise;Cryotherapy;Electrical Stimulation;Passive range of motion    PT Next Visit Plan  add in functional Rt shoulder strength to progress to golfing motions, if still doing well will anticipate discharge next week.     Consulted and Agree with Plan of Care  Patient       Patient will benefit from skilled therapeutic intervention in order to improve the following deficits and impairments:  Pain, Improper body mechanics, Postural dysfunction, Decreased range of motion, Decreased strength, Impaired UE functional use  Visit Diagnosis: Acute pain of right shoulder  Muscle weakness (generalized)  Abnormal posture  Chronic left shoulder pain     Problem List Patient Active Problem List   Diagnosis Date Noted  . Left rotator cuff tear 05/21/2011  . UNSPECIFIED SUDDEN HEARING LOSS 01/06/2011  . PROSTATE CANCER 04/15/2008  . HYPOGLYCEMIA 04/15/2008  . ANXIETY  04/15/2008  . BRADYCARDIA 04/15/2008  . ESOPHAGEAL REFLUX 04/15/2008  . INSOMNIA 04/15/2008  . COUGH 04/15/2008  . CHEST PAIN,  RECURRENT 04/15/2008  . MALIGNANT MELANOMA, HX OF 04/15/2008  . BENIGN PROSTATIC HYPERTROPHY, HX OF 04/15/2008    Jeral Pinch PT  04/08/2017, 10:05 AM  [facility] [place], [place], [address] Phone: [phone]   Fax:  [phone]  Name: Jack Howard MRN: [id number] Date of Birth: 12-23-1935

## 2017-04-11 ENCOUNTER — Encounter: Payer: Self-pay | Admitting: Physical Therapy

## 2017-04-11 ENCOUNTER — Ambulatory Visit: Payer: Medicare Other | Admitting: Physical Therapy

## 2017-04-11 DIAGNOSIS — M6281 Muscle weakness (generalized): Secondary | ICD-10-CM | POA: Diagnosis not present

## 2017-04-11 DIAGNOSIS — M25511 Pain in right shoulder: Secondary | ICD-10-CM | POA: Diagnosis not present

## 2017-04-11 DIAGNOSIS — R293 Abnormal posture: Secondary | ICD-10-CM

## 2017-04-11 NOTE — Therapy (Addendum)
[place], [place], [address] Phone: [phone]   Fax:  [phone]  Physical Therapy Treatment  Patient Details  Name: Jack Howard MRN: [id number] Date of Birth: 29-Dec-1935 Referring Provider: Dr. Damaris Hippo   Encounter Date: 04/11/2017  PT End of Session - 04/11/17 0934    Visit Number  6    Number of Visits  12    Date for PT Re-Evaluation  04/29/17    PT Start Time  0930    PT Stop Time  6720 MHP last 12 min     PT Time Calculation (min)  44 min    Activity Tolerance  Patient tolerated treatment well    Behavior During Therapy  [facility] for tasks assessed/performed       Past Medical History:  Diagnosis Date  . Anxiety   . Dementia 07/04/2016   new diagnosis of early stages of alzheimers dementia 3 weeks ago   . GERD (gastroesophageal reflux disease)   . Insomnia   . Left rotator cuff tear 05/21/2011  . Malignant melanoma ([place])   . Prostate cancer [facility])     Past Surgical History:  Procedure Laterality Date  . EYE SURGERY     both cataracts  . HEMORRHOID SURGERY    . KNEE SURGERY    . PROSTATE SURGERY    . ROTATOR CUFF REPAIR  05/21/2011  . TONSILLECTOMY      There were no vitals filed for this visit.  Subjective Assessment - 04/11/17 0934    Subjective  "Im doing great!  This is my last visit".  Pt reports he started taking OPC 3 - 5 days ago.  He has not had any shoulder pain in over 3 days.     Pertinent History  new dx of beginning dementia , Lt RTC repair 2013    Patient Stated Goals  play golf and all activities with his baseline pain.     Currently in Pain?  No/denies    Pain Score  0-No pain         [facility] PT Assessment - 04/11/17 0001      Assessment   Medical Diagnosis  Rt RTC tendonitis    Referring Provider  Dr. Damaris Hippo    Onset Date/Surgical Date  02/25/17    Hand Dominance  Right    Next MD Visit  PRN      Observation/Other Assessments   Focus  on Therapeutic Outcomes (FOTO)   21% limited.         [facility] Adult PT Treatment/Exercise - 04/11/17 0001      Lumbar Exercises: Stretches   Single Knee to Chest Stretch  2 reps;20 seconds    Double Knee to Chest Stretch  1 rep;20 seconds    Lower Trunk Rotation  30 seconds      Shoulder Exercises: Supine   Horizontal ABduction  Strengthening;Both;Theraband 1 set with bridge; 1 set without.     Theraband Level (Shoulder Horizontal ABduction)  Level 3 (Green)    External Rotation  Strengthening;Both;10 reps;Theraband , 2 sets   Theraband Level (Shoulder External Rotation)  Level 3 (Green)    Other Supine Exercises  overhead pull, sash - with green band each x 10 reps, 2 sets.        Shoulder Exercises: Stretch   Other Shoulder Stretches  low and mid doorway stretch x 30 sec x 2 reps each position (VC for technique).  high level position, to tolerance  for 30 sec    Other Shoulder Stretches  shoulder ext stretch with cane behind back (dynamic) x 10 reps       Moist Heat Therapy   Number Minutes Moist Heat  12 Minutes    Moist Heat Location  Lumbar Spine      Manual Therapy   Soft tissue mobilization  STM to Rt deltoid, post shoulder, pec     Myofascial Release  Rt pec release.                   PT Long Term Goals - 04/11/17 0935      PT LONG TERM GOAL #1   Title  i with advanced HEP ( 04/29/17)     Time  6    Period  Weeks    Status  Achieved      PT LONG TERM GOAL #2   Title  report =/> 75% reduction in Rt shoulder pain to allow him to return to his normal sleep pattern ( 04/29/17)     Time  6    Status  Achieved      PT LONG TERM GOAL #3   Title  improve FOTO =/<30 % limited, CJ level (04/29/17)     Status  Achieved      PT LONG TERM GOAL #4   Title  demo bilat shoulder strength =/> 5-/5 without pain ( 04/29/17)     Time  6    Period  Weeks    Status  Achieved      PT LONG TERM GOAL #5   Title  play golf per his previous level of shoulder discomfort (  04/29/17)     Status  Unable to assess too cold to golf.             Plan - 04/11/17 0950    Clinical Impression Statement  Pt tolerated all exercises well, without any production of symptoms.  He had some occasional "catches" in his low back with supine shoulder exercises; resolved with rest and gentle stretches.  Pt has met all goals except his golfing goal (unable to assess due to weather).     Rehab Potential  Good    PT Frequency  2x / week    PT Duration  6 weeks    PT Treatment/Interventions  Iontophoresis 77m/ml Dexamethasone;Dry needling;Manual techniques;Moist Heat;Ultrasound;Therapeutic activities;Patient/family education;Taping;Therapeutic exercise;Cryotherapy;Electrical Stimulation;Passive range of motion    PT Next Visit Plan  spoke to supervising PT; will d/c per pt request.     Consulted and Agree with Plan of Care  Patient       Patient will benefit from skilled therapeutic intervention in order to improve the following deficits and impairments:  Pain, Improper body mechanics, Postural dysfunction, Decreased range of motion, Decreased strength, Impaired UE functional use  Visit Diagnosis: Acute pain of right shoulder  Muscle weakness (generalized)  Abnormal posture     Problem List Patient Active Problem List   Diagnosis Date Noted  . Left rotator cuff tear 05/21/2011  . UNSPECIFIED SUDDEN HEARING LOSS 01/06/2011  . PROSTATE CANCER 04/15/2008  . HYPOGLYCEMIA 04/15/2008  . ANXIETY 04/15/2008  . BRADYCARDIA 04/15/2008  . ESOPHAGEAL REFLUX 04/15/2008  . INSOMNIA 04/15/2008  . COUGH 04/15/2008  . CHEST PAIN, RECURRENT 04/15/2008  . MALIGNANT MELANOMA, HX OF 04/15/2008  . BENIGN PROSTATIC HYPERTROPHY, HX OF 04/15/2008   JKerin Perna PTA 04/11/17 10:19 AM  [id number] [address]  Phone: [phone]   Fax:  [phone]  Name: Jack Howard MRN:  [id number] Date of Birth: 1935-09-10   PHYSICAL THERAPY DISCHARGE SUMMARY  Visits from Start of Care: 6  Current functional level related to goals / functional outcomes: See above   Remaining deficits: None, he has not attempted to golf however simulation in the clinic has been fine.    Education / Equipment: HEP  Plan: Patient agrees to discharge.  Patient goals were met. Patient is being discharged due to meeting the stated rehab goals.  ?????    Jeral Pinch, PT 04/11/17 10:22 AM

## 2017-07-22 ENCOUNTER — Encounter: Payer: Self-pay | Admitting: Podiatry

## 2017-07-22 ENCOUNTER — Ambulatory Visit: Payer: Medicare Other | Admitting: Podiatry

## 2017-07-22 DIAGNOSIS — S90221A Contusion of right lesser toe(s) with damage to nail, initial encounter: Secondary | ICD-10-CM | POA: Diagnosis not present

## 2017-07-22 DIAGNOSIS — L600 Ingrowing nail: Secondary | ICD-10-CM

## 2017-07-22 MED ORDER — CEPHALEXIN 500 MG PO CAPS: 500.0000 mg | ORAL_CAPSULE | Freq: Two times a day (BID) | ORAL | 2 refills | Status: DC

## 2017-07-22 NOTE — Progress Notes (Signed)
Subjective:    Patient ID: Jack Howard, male    DOB: 02/10/1936, 82 y.o.   MRN: [id number]  HPI 82 year old male presents the office today for concerns of painful ingrowing to the left big toe which is been ongoing for the last couple weeks has been getting worse to the area is painful with pressure in shoes.  He denies any drainage or pus coming from the area.  He also states that the right big toenail turned dark but is been growing out.  He does get pedicures but this seems to make the ingrown toenail left side worse.  He has no other concerns today.   Review of Systems  All other systems reviewed and are negative.  Past Medical History:  Diagnosis Date  . Anxiety   . Dementia 07/04/2016   new diagnosis of early stages of alzheimers dementia 3 weeks ago   . GERD (gastroesophageal reflux disease)   . Insomnia   . Left rotator cuff tear 05/21/2011  . Malignant melanoma ([place])   . Prostate cancer [facility])     Past Surgical History:  Procedure Laterality Date  . EYE SURGERY     both cataracts  . HEMORRHOID SURGERY    . KNEE SURGERY    . PROSTATE SURGERY    . ROTATOR CUFF REPAIR  05/21/2011  . TONSILLECTOMY       Current Outpatient Medications:  .  cephALEXin (KEFLEX) 500 MG capsule, Take 1 capsule (500 mg total) by mouth 2 (two) times daily., Disp: 14 capsule, Rfl: 2 .  citalopram (CELEXA) 20 MG tablet, Take 20 mg by mouth daily.  , Disp: , Rfl:  .  Memantine HCl (NAMENDA PO), Take by mouth., Disp: , Rfl:  .  omeprazole (PRILOSEC) 20 MG capsule, Take 20 mg by mouth daily.  , Disp: , Rfl:  .  zolpidem (AMBIEN) 5 MG tablet, Take 5 mg by mouth at bedtime as needed for sleep., Disp: , Rfl:   No Known Allergies  Social History   Socioeconomic History  . Marital status: Married    Spouse name: Not on file  . Number of children: Not on file  . Years of education: Not on file  . Highest education level: Not on file  Occupational History  . Not on file  Social Needs  .  Financial resource strain: Not on file  . Food insecurity:    Worry: Not on file    Inability: Not on file  . Transportation needs:    Medical: Not on file    Non-medical: Not on file  Tobacco Use  . Smoking status: Never Smoker  . Smokeless tobacco: Never Used  Substance and Sexual Activity  . Alcohol use: No  . Drug use: No  . Sexual activity: Not on file  Lifestyle  . Physical activity:    Days per week: Not on file    Minutes per session: Not on file  . Stress: Not on file  Relationships  . Social connections:    Talks on phone: Not on file    Gets together: Not on file    Attends religious service: Not on file    Active member of club or organization: Not on file    Attends meetings of clubs or organizations: Not on file    Relationship status: Not on file  . Intimate partner violence:    Fear of current or ex partner: Not on file    Emotionally abused: Not on file  Physically abused: Not on file    Forced sexual activity: Not on file  Other Topics Concern  . Not on file  Social History Narrative  . Not on file        Objective:   Physical Exam General: NAD, presents with his wife  Dermatological: On the medial lateral aspects of the left hallux toenail there is tenderness palpation of the nail corners there is significant incurvation on the corners of the medial side worse than the lateral.  There is minimal edema from inflammation but there is no erythema or increase in warmth.  There is no ascending cellulitis.  No drainage or pus.  On the right hallux, there is evidence of subungual hematoma to the distal portion of the nail which is able to debride there is no extension of any hyperpigmentation of the surrounding skin.  Subjectively he does state that this is been growing out as well.  There is no open lesions identified otherwise.  Overall hallux toenails to be somewhat discolored and hypertrophic.  Vascular: Dorsalis Pedis artery and Posterior Tibial artery  pedal pulses are 2/4 bilateral with immedate capillary fill time. There is no pain with calf compression, swelling, warmth, erythema.   Neruologic: Grossly intact via light touch bilateral.Protective threshold with Semmes Wienstein monofilament intact to all pedal sites bilateral.   Musculoskeletal: No gross boney pedal deformities bilateral. No pain, crepitus, or limitation noted with foot and ankle range of motion bilateral. Muscular strength 5/5 in all groups tested bilateral.  Gait: Unassisted, Nonantalgic.     Assessment & Plan:  82 year old male left symptomatic ingrown toenail, right hallux toenail old subungual hematoma -Treatment options discussed including all alternatives, risks, and complications -Etiology of symptoms were discussed -At this time, the patient is requesting partial nail removal with chemical matricectomy to the symptomatic portion of the nail. Risks and complications were discussed with the patient for which they understand and written consent was obtained. Under sterile conditions a total of 3 mL of a mixture of 2% lidocaine plain and 0.5% Marcaine plain was infiltrated in a hallux block fashion. Once anesthetized, the skin was prepped in sterile fashion. A tourniquet was then applied. Next the medial/lateral aspect of hallux nail border was then sharply excised making sure to remove the entire offending nail border. Once the nails were ensured to be removed area was debrided and the underlying skin was intact. There is no purulence identified in the procedure. Next phenol was then applied under standard conditions and copiously irrigated. Silvadene was applied. A dry sterile dressing was applied. After application of the dressing the tourniquet was removed and there is found to be an immediate capillary refill time to the digit. The patient tolerated the procedure well any complications. Post procedure instructions were discussed the patient for which he verbally understood.  Follow-up in one week for nail check or sooner if any problems are to arise. Discussed signs/symptoms of infection and directed to call the office immediately should any occur or go directly to the emergency room. In the meantime, encouraged to call the office with any questions, concerns, changes symptoms. -I debrided the right hallux toenail and to remove some of the loose toenail without any complications or bleeding. -[place] DPM

## 2017-07-22 NOTE — Patient Instructions (Signed)

## 2017-07-31 ENCOUNTER — Encounter: Payer: Self-pay | Admitting: Podiatry

## 2017-07-31 ENCOUNTER — Ambulatory Visit (INDEPENDENT_AMBULATORY_CARE_PROVIDER_SITE_OTHER): Payer: Medicare Other | Admitting: Podiatry

## 2017-07-31 DIAGNOSIS — Z9889 Other specified postprocedural states: Secondary | ICD-10-CM

## 2017-07-31 DIAGNOSIS — L600 Ingrowing nail: Secondary | ICD-10-CM

## 2017-07-31 MED ORDER — SULFAMETHOXAZOLE-TRIMETHOPRIM 800-160 MG PO TABS: 1.0000 | ORAL_TABLET | Freq: Two times a day (BID) | ORAL | 0 refills | Status: DC

## 2017-07-31 NOTE — Patient Instructions (Signed)
Continue soaking in epsom salts twice a day followed by antibiotic ointment and a band-aid. Can leave uncovered at night. Continue this until completely healed.  Monitor for any signs/symptoms of infection. Call the office immediately if any occur or go directly to the emergency room. Call with any questions/concerns.  

## 2017-07-31 NOTE — Progress Notes (Signed)
Subjective: Jack Howard is a 82 y.o.  male returns to office today for follow up evaluation after having left hallux partial nail avulsion performed. Patient has been soaking using epsom salts and applying topical antibiotic covered with bandaid daily.  He states he went to the Keflex for about 4 days because it caused stomach pain.  He has noticed a small amount of redness on the procedure site but there is no red streaks or any drainage or pus.  Pain is improving.  Patient denies fevers, chills, nausea, vomiting. Denies any calf pain, chest pain, SOB.   Objective:  Vitals: Reviewed  General: Well developed, nourished, in no acute distress, alert and oriented x3   Dermatology: Skin is warm, dry and supple bilateral. Left  hallux nail border appears to be clean, dry, with mild granular tissue and surrounding scab. There is no faint surrounding erythema however there is no ascending cellulitis, edema, drainage/purulence. The remaining nails appear unremarkable at this time. There are no other lesions or other signs of infection present.  Neurovascular status: Intact. No lower extremity swelling; No pain with calf compression bilateral.  Musculoskeletal: Decreased tenderness to palpation of the left hallux nail folds. Muscular strength within normal limits bilateral.   Assesement and Plan: S/p partial nail avulsion, doing well.   -Continue soaking in epsom salts twice a day followed by antibiotic ointment and a band-aid. Can leave uncovered at night. Continue this until completely healed.  -Given the faint erythema I did start Bactrim which was sent to his pharmacy.  We will hold off on Keflex as this was causing stomach upset. -Follow-up in 2 weeks or sooner if needed. -Monitor for any signs/symptoms of infection. Call the office immediately if any occur or go directly to the emergency room. Call with any questions/concerns.  Celesta Gentile, DPM  His wife just recently got out of the  hospital for pancreatitis

## 2017-08-01 ENCOUNTER — Ambulatory Visit: Payer: Medicare Other | Admitting: Podiatry

## 2017-08-14 ENCOUNTER — Ambulatory Visit (INDEPENDENT_AMBULATORY_CARE_PROVIDER_SITE_OTHER): Payer: Medicare Other | Admitting: Podiatry

## 2017-08-14 DIAGNOSIS — L600 Ingrowing nail: Secondary | ICD-10-CM

## 2017-08-14 DIAGNOSIS — Z9889 Other specified postprocedural states: Secondary | ICD-10-CM

## 2017-08-14 NOTE — Patient Instructions (Signed)

## 2017-08-17 NOTE — Progress Notes (Signed)
Subjective: 82 year old male presents the office today for follow-up evaluation status post partial nail avulsion.  He was unable to take the Bactrim because this upset his stomach and discontinued it.  He states the toe is doing much better.  He still has a little bit of tenderness to the area but overall the pain is much improved.  Denies any drainage or pus coming from the area.  He has no other concerns today.  He is still been soaking Epsom salts and keep antibiotic ointment and a bandage on the area daily. Denies any systemic complaints such as fevers, chills, nausea, vomiting. No acute changes since last appointment, and no other complaints at this time.   Objective: NAD-presents with wife DP/PT pulses palpable bilaterally, CRT less than 3 seconds Status post partial nail avulsion along the left hallux toenail.  Area appears to be scabbed over.  There is no drainage or pus expressed and there is no tenderness palpation of the procedure site.  There is faint surrounding dark in color erythema which is from the resolving inflammation however there is no increase in warmth and there is no ascending cellulitis I think this is more from the resolving inflammation as opposed to infection.  Overall the toe appears to be much improved. No open lesions or pre-ulcerative lesions.  No pain with calf compression, swelling, warmth, erythema  Assessment: Status post partial nail avulsion with improvement  Plan: -All treatment options discussed with the patient including all alternatives, risks, complications.  -Also to continue soaking Epsom salts soaks twice a day cover with antibiotic ointment and a bandage during the day but leave the area uncovered at nighttime.  At this point he has not been able to tolerate the last 2 antibiotics already hold off any further antibiotics and actually the toe is looking much better.  Does not appear to be in signs of infection.  It does not heal completely within 2 weeks  or so any tenderness or redness he is to call the office for follow-up otherwise I will see him back as needed and he agrees with this plan. -Patient encouraged to call the office with any questions, concerns, change in symptoms.   Trula Slade DPM

## 2017-09-04 ENCOUNTER — Other Ambulatory Visit: Payer: Self-pay | Admitting: Family Medicine

## 2017-09-04 ENCOUNTER — Ambulatory Visit
Admission: RE | Admit: 2017-09-04 | Discharge: 2017-09-04 | Disposition: A | Discharge status: Home / Self Care | Payer: Medicare Other | Source: Ambulatory Visit | Attending: Family Medicine | Admitting: Family Medicine

## 2017-09-04 DIAGNOSIS — M545 Low back pain: Secondary | ICD-10-CM

## 2018-04-20 ENCOUNTER — Emergency Department (INDEPENDENT_AMBULATORY_CARE_PROVIDER_SITE_OTHER)
Admission: EM | Admit: 2018-04-20 | Discharge: 2018-04-20 | Disposition: A | Discharge status: Home / Self Care | Payer: Medicare Other | Source: Home / Self Care | Attending: Emergency Medicine | Admitting: Emergency Medicine

## 2018-04-20 ENCOUNTER — Encounter: Payer: Self-pay | Admitting: Emergency Medicine

## 2018-04-20 DIAGNOSIS — J209 Acute bronchitis, unspecified: Secondary | ICD-10-CM

## 2018-04-20 DIAGNOSIS — J0100 Acute maxillary sinusitis, unspecified: Secondary | ICD-10-CM | POA: Diagnosis not present

## 2018-04-20 MED ORDER — CLARITHROMYCIN 500 MG PO TABS: ORAL_TABLET | ORAL | 0 refills | Status: DC

## 2018-04-20 NOTE — ED Provider Notes (Signed)
[facility] Howard CARE    CSN: [id number] Arrival date & time: 04/20/18  0858     History   Chief Complaint Chief Complaint  Patient presents with  . Cough    HPI Jack Howard is a 82 y.o. male.   HPI URI HISTORY  Jack Howard is a 83 y.o. male who complains of onset of cold symptoms for 2 weeks.  Progressively worsening.   Has been using over-the-counter treatment which helps a little bit.  No chills/sweats + Low-grade fever +  Nasal congestion +  Discolored Post-nasal drainage No sinus pain/pressure No sore throat  + Congested cough, yellow sputum without blood. No wheezing Mild chest congestion No hemoptysis No shortness of breath No pleuritic pain  No itchy/red eyes No earache  No nausea No vomiting No abdominal pain No diarrhea  No skin rashes +  Fatigue.  No lightheadedness or syncope. No myalgias No headache   Past Medical History:  Diagnosis Date  . Anxiety   . Dementia ([place]) 07/04/2016   new diagnosis of early stages of alzheimers dementia 3 weeks ago   . GERD (gastroesophageal reflux disease)   . Insomnia   . Left rotator cuff tear 05/21/2011  . Malignant melanoma ([place])   . Prostate cancer [facility])   Above past medical history reviewed and noted. History of right lower lobe pneumonia 2016  Patient Active Problem List   Diagnosis Date Noted  . Left rotator cuff tear 05/21/2011  . UNSPECIFIED SUDDEN HEARING LOSS 01/06/2011  . PROSTATE CANCER 04/15/2008  . HYPOGLYCEMIA 04/15/2008  . ANXIETY 04/15/2008  . BRADYCARDIA 04/15/2008  . ESOPHAGEAL REFLUX 04/15/2008  . INSOMNIA 04/15/2008  . COUGH 04/15/2008  . CHEST PAIN, RECURRENT 04/15/2008  . MALIGNANT MELANOMA, HX OF 04/15/2008  . BENIGN PROSTATIC HYPERTROPHY, HX OF 04/15/2008    Past Surgical History:  Procedure Laterality Date  . EYE SURGERY     both cataracts  . HEMORRHOID SURGERY    . KNEE SURGERY    . PROSTATE SURGERY    . ROTATOR CUFF REPAIR  05/21/2011  . TONSILLECTOMY          Home Medications    Prior to Admission medications   Medication Sig Start Date End Date Taking? Authorizing Provider  citalopram (CELEXA) 20 MG tablet Take 20 mg by mouth daily.      [provider]  clarithromycin (BIAXIN) 500 MG tablet Take 1 twice a day for 10 days. 04/20/18   Jacqulyn Cane, MD  Memantine HCl (NAMENDA PO) Take by mouth.    [provider]  omeprazole (PRILOSEC) 20 MG capsule Take 20 mg by mouth daily.      [provider]  zolpidem (AMBIEN) 5 MG tablet Take 5 mg by mouth at bedtime as needed for sleep.    [provider]    Family History Family History  Problem Relation Age of Onset  . Breast cancer Mother   . Prostate cancer Father   . Leukemia Brother     Social History Social History   Tobacco Use  . Smoking status: Never Smoker  . Smokeless tobacco: Never Used  Substance Use Topics  . Alcohol use: No  . Drug use: No     Allergies   Patient has no known allergies.   Review of Systems Review of Systems  All other systems reviewed and are negative.    Physical Exam Triage Vital Signs ED Triage Vitals  Enc Vitals Group     BP  Pulse      Resp      Temp      Temp src      SpO2      Weight      Height      Head Circumference      Peak Flow      Pain Score      Pain Loc      Pain Edu?      Excl. in [place]?    No data found.  Updated Vital Signs BP 123/77 (BP Location: Right Arm)   Pulse 60   Temp 97.8 F (36.6 C) (Oral)   Wt 70.3 kg   SpO2 98%   BMI 23.57 kg/m   Visual Acuity Right Eye Distance:   Left Eye Distance:   Bilateral Distance:    Right Eye Near:   Left Eye Near:    Bilateral Near:     Physical Exam Vitals signs and nursing note reviewed.  Constitutional:      General: He is not in acute distress.    Appearance: He is well-developed.  HENT:     Head: Normocephalic and atraumatic.     Right Ear: Tympanic membrane, ear canal and external ear normal.      Left Ear: Tympanic membrane, ear canal and external ear normal.     Nose: Mucosal edema and rhinorrhea present.     Right Sinus: Maxillary sinus tenderness present.     Left Sinus: Maxillary sinus tenderness present.     Mouth/Throat:     Mouth: No oral lesions.     Pharynx: No oropharyngeal exudate.  Eyes:     General: No scleral icterus.       Right eye: No discharge.        Left eye: No discharge.  Neck:     Musculoskeletal: Neck supple.  Cardiovascular:     Rate and Rhythm: Normal rate and regular rhythm.     Heart sounds: Normal heart sounds.  Pulmonary:     Effort: Pulmonary effort is normal. No respiratory distress.     Breath sounds: Rhonchi present. No wheezing or rales.     Comments: Good air movement bilaterally.  Oxygen saturation 98% room air Abdominal:     General: There is no distension.     Tenderness: There is no abdominal tenderness.  Lymphadenopathy:     Cervical: No cervical adenopathy.  Skin:    General: Skin is warm and dry.     Capillary Refill: Capillary refill takes less than 2 seconds.     Findings: No rash.  Neurological:     Mental Status: He is alert and oriented to person, place, and time.  Psychiatric:        Mood and Affect: Mood normal.        Behavior: Behavior normal.        Judgment: Judgment normal.      UC Treatments / Results  Labs (all labs ordered are listed, but only abnormal results are displayed) Labs Reviewed - No data to display  EKG None  Radiology No results found.  Procedures Procedures (including critical care time)  Medications Ordered in UC Medications - No data to display  Initial Impression / Assessment and Plan / UC Course  I have reviewed the triage vital signs and the nursing notes.  Pertinent labs & imaging results that were available during my care of the patient were reviewed by me and considered in my medical decision making (see chart  for details).      Final Clinical Impressions(s) / UC  Diagnoses   Final diagnoses:  Acute bronchitis, unspecified organism  Acute non-recurrent maxillary sinusitis  Symptoms progressive for 2 weeks.  Clinically no evidence of pneumonia on physical exam.  He is not toxic appearing, although he is ill. In my opinion, he can be treated as an outpatient with close follow-up with PCP within 1 week or sooner if worse.  Discussed treatment plan with patient.  AVS printed and given to patient. Follow-up with your primary care doctor in 7 days if not improving, or sooner if symptoms become worse. Precautions discussed. Red flags discussed. Questions invited and answered. Patient voiced understanding and agreement.     Discharge Instructions     Diagnosis is sinus infection and bronchitis.  Listening to your lungs, there is no sign of pneumonia.  In my opinion, your recent skin surgery on your nose is not causing the sinus infection. Treatment: Rest, plenty of fluids.  Tylenol if needed for pain or fever. Prescription sent to your pharmacy, [facility], for antibiotic: Clarithromycin twice a day for 10 days.-(This covers sinus germs and bronchitis germs) May use Robitussin-DM as needed for cough. Please read information attached below about sinus infections and bronchitis. Follow-up with your PCP if not better in 1 week, or seek medical care sooner if any severe or worsening symptoms.    ED Prescriptions    Medication Sig Dispense Auth. Provider   clarithromycin (BIAXIN) 500 MG tablet Take 1 twice a day for 10 days. 20 tablet Jacqulyn Cane, MD     Controlled Substance Prescriptions [place] Controlled Substance Registry consulted? Not Applicable   Jacqulyn Cane, MD 04/20/18 [phone]

## 2018-04-20 NOTE — Discharge Instructions (Addendum)
Diagnosis is sinus infection and bronchitis.  Listening to your lungs, there is no sign of pneumonia.  In my opinion, your recent skin surgery on your nose is not causing the sinus infection. Treatment: Rest, plenty of fluids.  Tylenol if needed for pain or fever. Prescription sent to your pharmacy, [facility], for antibiotic: Clarithromycin twice a day for 10 days.-(This covers sinus germs and bronchitis germs) May use Robitussin-DM as needed for cough. Please read information attached below about sinus infections and bronchitis. Follow-up with your PCP if not better in 1 week, or seek medical care sooner if any severe or worsening symptoms.

## 2018-04-20 NOTE — ED Triage Notes (Signed)
Pt c/o cough with mucous x2 weeks. Afebrile and taking OTC meds.

## 2018-07-02 ENCOUNTER — Emergency Department (INDEPENDENT_AMBULATORY_CARE_PROVIDER_SITE_OTHER)
Admission: EM | Admit: 2018-07-02 | Discharge: 2018-07-02 | Disposition: A | Discharge status: Home / Self Care | Payer: Medicare Other | Source: Home / Self Care

## 2018-07-02 ENCOUNTER — Emergency Department (INDEPENDENT_AMBULATORY_CARE_PROVIDER_SITE_OTHER): Payer: Medicare Other

## 2018-07-02 ENCOUNTER — Other Ambulatory Visit: Payer: Self-pay

## 2018-07-02 ENCOUNTER — Encounter: Payer: Self-pay | Admitting: Emergency Medicine

## 2018-07-02 DIAGNOSIS — R6889 Other general symptoms and signs: Secondary | ICD-10-CM

## 2018-07-02 DIAGNOSIS — R05 Cough: Secondary | ICD-10-CM | POA: Diagnosis not present

## 2018-07-02 DIAGNOSIS — Z20828 Contact with and (suspected) exposure to other viral communicable diseases: Secondary | ICD-10-CM

## 2018-07-02 MED ORDER — BENZONATATE 100 MG PO CAPS: 100.0000 mg | ORAL_CAPSULE | Freq: Three times a day (TID) | ORAL | 0 refills | Status: DC

## 2018-07-02 MED ORDER — OSELTAMIVIR PHOSPHATE 75 MG PO CAPS: 75.0000 mg | ORAL_CAPSULE | Freq: Two times a day (BID) | ORAL | 0 refills | Status: DC

## 2018-07-02 NOTE — Discharge Instructions (Signed)
°  You may take Tylenol and Motrin as needed for pain or fever.  Please follow up with your family doctor in 5-7 days if not improving, sooner if worsening.  Call 911 or go to the hospital if significantly worsening.  Oseltamivir (Tamiflu) may cause stomach upset including nausea, vomiting and diarrhea.  It may also cause dizziness or hallucinations in children.  To help prevent stomach upset, you may take this medication with food.  If you are still having unwanted symptoms, you may stop taking this medication as it is not as important to finish the entire course like antibiotics.  If you have questions/concerns please call our office or follow up with your primary care provider.

## 2018-07-02 NOTE — ED Provider Notes (Signed)
Jack Howard CARE    CSN: [id number] Arrival date & time: 07/02/18  1720     History   Chief Complaint Chief Complaint  Patient presents with  . Cough    HPI CON Jack Howard is a 83 y.o. male.   HPI Jack Howard is a 83 y.o. male presenting to UC with c/o sudden onset rhinorrhea, cough, body aches, mild dizziness and fatigue that started yesterday.  Pt held a funeral for his wife two days ago and was around over 400 people. He notes several people were sick at the funeral including his aunt who was dx with pneumonia. Pt denies chest pain or SOB.  Denies known fever. Denies n/v/d. He did receive the flu vaccine.    Past Medical History:  Diagnosis Date  . Anxiety   . Dementia ([place]) 07/04/2016   new diagnosis of early stages of alzheimers dementia 3 weeks ago   . GERD (gastroesophageal reflux disease)   . Insomnia   . Left rotator cuff tear 05/21/2011  . Malignant melanoma ([place])   . Prostate cancer [facility])     Patient Active Problem List   Diagnosis Date Noted  . Left rotator cuff tear 05/21/2011  . UNSPECIFIED SUDDEN HEARING LOSS 01/06/2011  . PROSTATE CANCER 04/15/2008  . HYPOGLYCEMIA 04/15/2008  . ANXIETY 04/15/2008  . BRADYCARDIA 04/15/2008  . ESOPHAGEAL REFLUX 04/15/2008  . INSOMNIA 04/15/2008  . COUGH 04/15/2008  . CHEST PAIN, RECURRENT 04/15/2008  . MALIGNANT MELANOMA, HX OF 04/15/2008  . BENIGN PROSTATIC HYPERTROPHY, HX OF 04/15/2008    Past Surgical History:  Procedure Laterality Date  . EYE SURGERY     both cataracts  . HEMORRHOID SURGERY    . KNEE SURGERY    . PROSTATE SURGERY    . ROTATOR CUFF REPAIR  05/21/2011  . TONSILLECTOMY         Home Medications    Prior to Admission medications   Medication Sig Start Date End Date Taking? Authorizing Provider  benzonatate (TESSALON) 100 MG capsule Take 1-2 capsules (100-200 mg total) by mouth every 8 (eight) hours. 07/02/18   Noe Gens, PA-C  citalopram (CELEXA) 20 MG tablet Take  20 mg by mouth daily.      [provider]  Memantine HCl (NAMENDA PO) Take by mouth.    [provider]  omeprazole (PRILOSEC) 20 MG capsule Take 20 mg by mouth daily.      [provider]  oseltamivir (TAMIFLU) 75 MG capsule Take 1 capsule (75 mg total) by mouth every 12 (twelve) hours. 07/02/18   Noe Gens, PA-C  zolpidem (AMBIEN) 5 MG tablet Take 5 mg by mouth at bedtime as needed for sleep.    [provider]    Family History Family History  Problem Relation Age of Onset  . Breast cancer Mother   . Prostate cancer Father   . Leukemia Brother     Social History Social History   Tobacco Use  . Smoking status: Never Smoker  . Smokeless tobacco: Never Used  Substance Use Topics  . Alcohol use: No  . Drug use: No     Allergies   Patient has no known allergies.   Review of Systems Review of Systems  Constitutional: Positive for chills and fatigue. Negative for fever.  HENT: Positive for congestion and rhinorrhea. Negative for ear pain, sore throat, trouble swallowing and voice change.   Respiratory: Positive for cough. Negative for shortness of breath.   Cardiovascular: Negative for  chest pain and palpitations.  Gastrointestinal: Negative for abdominal pain, diarrhea, nausea and vomiting.  Musculoskeletal: Positive for myalgias. Negative for arthralgias and back pain.  Skin: Negative for rash.  Neurological: Positive for dizziness. Negative for headaches.     Physical Exam Triage Vital Signs ED Triage Vitals  Enc Vitals Group     BP 07/02/18 1735 113/67     Pulse Rate 07/02/18 1735 71     Resp --      Temp 07/02/18 1735 98 F (36.7 C)     Temp Source 07/02/18 1735 Oral     SpO2 07/02/18 1735 99 %     Weight 07/02/18 1736 152 lb (68.9 kg)     Height 07/02/18 1736 5\' 7"  (1.702 m)     Head Circumference --      Peak Flow --      Pain Score 07/02/18 1735 2     Pain Loc --      Pain Edu? --      Excl. in [place]? --    No  data found.  Updated Vital Signs BP 113/67 (BP Location: Right Arm)   Pulse 71   Temp 98 F (36.7 C) (Oral)   Ht 5\' 7"  (1.702 m)   Wt 152 lb (68.9 kg)   SpO2 99%   BMI 23.81 kg/m   Visual Acuity Right Eye Distance:   Left Eye Distance:   Bilateral Distance:    Right Eye Near:   Left Eye Near:    Bilateral Near:     Physical Exam Vitals signs and nursing note reviewed.  Constitutional:      Appearance: Normal appearance. He is well-developed.  HENT:     Head: Normocephalic and atraumatic.     Right Ear: Tympanic membrane normal.     Left Ear: Tympanic membrane normal.     Nose: Nose normal.     Right Sinus: No maxillary sinus tenderness or frontal sinus tenderness.     Left Sinus: No maxillary sinus tenderness or frontal sinus tenderness.     Mouth/Throat:     Lips: Pink.     Mouth: Mucous membranes are moist.     Pharynx: Oropharynx is clear. Uvula midline.  Neck:     Musculoskeletal: Normal range of motion.  Cardiovascular:     Rate and Rhythm: Normal rate and regular rhythm.  Pulmonary:     Effort: Pulmonary effort is normal. No respiratory distress.     Breath sounds: Normal breath sounds. No stridor. No wheezing or rhonchi.  Musculoskeletal: Normal range of motion.  Skin:    General: Skin is warm and dry.  Neurological:     Mental Status: He is alert and oriented to person, place, and time.  Psychiatric:        Behavior: Behavior normal.      UC Treatments / Results  Labs (all labs ordered are listed, but only abnormal results are displayed) Labs Reviewed - No data to display  EKG None  Radiology Dg Chest 2 View  Result Date: 07/02/2018 CLINICAL DATA:  Cough, congestion EXAM: CHEST - 2 VIEW COMPARISON:  05/27/2015 FINDINGS: Heart and mediastinal contours are within normal limits. No focal opacities or effusions. No acute bony abnormality. IMPRESSION: No active cardiopulmonary disease. Electronically Signed   By: Rolm Baptise M.D.   On: 07/02/2018  18:09    Procedures Procedures (including critical care time)  Medications Ordered in UC Medications - No data to display  Initial Impression / Assessment and  Plan / UC Course  I have reviewed the triage vital signs and the nursing notes.  Pertinent labs & imaging results that were available during my care of the patient were reviewed by me and considered in my medical decision making (see chart for details).     Reviewed imaging  Reassured pt no evidence of pneumonia, however, hx c/w influenza Will tx empirically with tamiflu Home care info provided Discussed symptoms that warrant emergent care in the ED.  Final Clinical Impressions(s) / UC Diagnoses   Final diagnoses:  Flu-like symptoms  Exposure to the flu     Discharge Instructions      You may take Tylenol and Motrin as needed for pain or fever.  Please follow up with your family doctor in 5-7 days if not improving, sooner if worsening.  Call 911 or go to the hospital if significantly worsening.  Oseltamivir (Tamiflu) may cause stomach upset including nausea, vomiting and diarrhea.  It may also cause dizziness or hallucinations in children.  To help prevent stomach upset, you may take this medication with food.  If you are still having unwanted symptoms, you may stop taking this medication as it is not as important to finish the entire course like antibiotics.  If you have questions/concerns please call our office or follow up with your primary care provider.       ED Prescriptions    Medication Sig Dispense Auth. Provider   oseltamivir (TAMIFLU) 75 MG capsule Take 1 capsule (75 mg total) by mouth every 12 (twelve) hours. 10 capsule Gerarda Fraction, Marwan Lipe O, PA-C   benzonatate (TESSALON) 100 MG capsule Take 1-2 capsules (100-200 mg total) by mouth every 8 (eight) hours. 21 capsule Noe Gens, PA-C     Controlled Substance Prescriptions [place] Controlled Substance Registry consulted? Not Applicable   Tyrell Antonio 07/02/18 1931

## 2018-07-02 NOTE — ED Triage Notes (Addendum)
Runny nose, cough, body aches, dizziness, fatigue x 1 day He buried his wife this week and has been around a lot of people.

## 2018-07-27 ENCOUNTER — Other Ambulatory Visit: Payer: Self-pay

## 2018-07-27 ENCOUNTER — Encounter (HOSPITAL_COMMUNITY): Payer: Self-pay | Admitting: Certified Registered"

## 2018-07-27 ENCOUNTER — Ambulatory Visit (HOSPITAL_COMMUNITY): Payer: Medicare Other

## 2018-07-27 ENCOUNTER — Other Ambulatory Visit: Payer: Self-pay | Admitting: Urology

## 2018-07-27 ENCOUNTER — Encounter (HOSPITAL_COMMUNITY): Payer: Self-pay | Admitting: Emergency Medicine

## 2018-07-27 ENCOUNTER — Ambulatory Visit (HOSPITAL_COMMUNITY)
Admission: AD | Admit: 2018-07-27 | Discharge: 2018-07-27 | Disposition: A | Discharge status: Home / Self Care | Payer: Medicare Other | Attending: Urology | Admitting: Urology

## 2018-07-27 ENCOUNTER — Encounter (HOSPITAL_COMMUNITY)
Admission: AD | Disposition: A | Discharge status: Home / Self Care | Payer: Self-pay | Source: Home / Self Care | Attending: Urology

## 2018-07-27 DIAGNOSIS — Z8546 Personal history of malignant neoplasm of prostate: Secondary | ICD-10-CM | POA: Diagnosis not present

## 2018-07-27 DIAGNOSIS — N201 Calculus of ureter: Secondary | ICD-10-CM | POA: Insufficient documentation

## 2018-07-27 HISTORY — PX: EXTRACORPOREAL SHOCK WAVE LITHOTRIPSY: SHX1557

## 2018-07-27 SURGERY — LITHOTRIPSY, ESWL
Anesthesia: Choice | Laterality: Right

## 2018-07-27 MED ORDER — TAMSULOSIN HCL 0.4 MG PO CAPS: 0.4000 mg | ORAL_CAPSULE | Freq: Every day | ORAL | 1 refills | Status: DC

## 2018-07-27 MED ORDER — HYDROCODONE-ACETAMINOPHEN 5-325 MG PO TABS: 1.0000 | ORAL_TABLET | Freq: Four times a day (QID) | ORAL | 0 refills | Status: DC | PRN

## 2018-07-27 MED ORDER — DIPHENHYDRAMINE HCL 25 MG PO CAPS
25.0000 mg | ORAL_CAPSULE | ORAL | Status: AC
  Administered 2018-07-27: 25 mg via ORAL
  Filled 2018-07-27: qty 1

## 2018-07-27 MED ORDER — CIPROFLOXACIN HCL 500 MG PO TABS
500.0000 mg | ORAL_TABLET | ORAL | Status: AC
  Administered 2018-07-27: 500 mg via ORAL
  Filled 2018-07-27: qty 1

## 2018-07-27 MED ORDER — DIAZEPAM 5 MG PO TABS
10.0000 mg | ORAL_TABLET | ORAL | Status: AC
  Administered 2018-07-27: 10 mg via ORAL
  Filled 2018-07-27: qty 2

## 2018-07-27 MED ORDER — SODIUM CHLORIDE 0.9 % IV SOLN
INTRAVENOUS | Status: DC
  Administered 2018-07-27: 12:00:00 via INTRAVENOUS

## 2018-07-27 MED ORDER — ONDANSETRON HCL 4 MG PO TABS: 4.0000 mg | ORAL_TABLET | Freq: Three times a day (TID) | ORAL | 1 refills | Status: DC | PRN

## 2018-07-27 NOTE — Discharge Instructions (Signed)

## 2018-07-27 NOTE — H&P (Signed)
CC: I have ureteral stone.  HPI: Jack Howard is a 83 year-old male patient who was referred by Dr. Mitzie Na., MD who is here for ureteral stone.  The problem is on the right side. He first stated noticing pain on 07/24/2018. This is not his first kidney stone. He is currently having flank pain, groin pain, nausea, and vomiting. He denies having back pain, fever, and chills. Pain is occuring on the right side. He has not caught a stone in his urine strainer since his symptoms began.   He has never had surgical treatment for calculi in the past.   Jack Howard is an 83 yo WM who was seen in the ER in [place] on Friday for right flank pain. He was found to have an 75m right UVJ stone. He continues to have pain primarily in the groin but it is not as severe as it was Friday. He has had nausea and vomiting. He has some increased urgency and a sensation of constipation. He has had no fever or chills.   He has a history of prostate cancer diagnosed in 2013. He has a T1c Gleason 7(3+4) disease and has had a prior TUNA and then a TURP in 2015 by Dr. HNevada Cranein 2015. he was being managed with surveillance but has had no recent PSA's as far as he knows. .     ALLERGIES: No Allergies    MEDICATIONS: Omeprazole  Tamsulosin Hcl 0.4 mg capsule  Citalopram Hbr 20 mg tablet  Ondansetron Hcl 4 mg tablet  Oxycodone-Acetaminophen 5 mg-325 mg tablet  Zolpidem Tartrate 10 mg tablet 0 Oral     GU PSH: Cystoscopy TUNA - 2009 Cystoscopy TURP      PSH Notes: Surg Prostate Transureth Dest Tissue Radiofreq Thermotherapy, Cataract Surgery, Hemorrhoidectomy   NON-GU PSH: Hemorrhoidectomy (favorite) - 2009 Shoulder Surgery (Unspecified)    GU PMH: BPH w/LUTS, Benign Prostatic Hypertrophy With Urinary Obstruction - 2014 ED due to arterial insufficiency, Erectile dysfunction due to arterial insufficiency - 2014 Elevated PSA, Elevated prostate specific antigen (PSA) - 2014 History of urolithiasis,  Nephrolithiasis - 2014 Obstructive and reflux uropathy, Unspec, Obstructive uropathy - 2014 Other microscopic hematuria, Microscopic hematuria - 2014 Prostate Cancer, Prostate cancer - 2014 Urge incontinence, Urge Incontinence Of Urine - 2014      PMH Notes:  2007-09-24 09:32:42 - Note: Normal Routine History And Physical Senior Citizen ([id number]  2007-05-28 16:24:46 - Note: Skin Cancer   NON-GU PMH: Adjustment insomnia, Adjustment insomnia - 2014 Anxiety, Anxiety - 2014 Personal history of other diseases of the digestive system, History of esophageal reflux - 2014 Arthritis GERD Sleep Apnea    FAMILY HISTORY: Alzheimer's Disease - Father, Mother Breast Cancer - Runs In Family Death In The Family Father - Brother Death In The Family Mother - [place] Family leukemia - Brother, Runs In Family Prostate Cancer - Father   SOCIAL HISTORY: Marital Status: Widowed Preferred Language: English; Race: White Current Smoking Status: Patient has never smoked.   Tobacco Use Assessment Completed: Used Tobacco in last 30 days? Drinks 3 caffeinated drinks per day.     Notes: 1 son, 1 daughter. He lost his wife a month ago.    REVIEW OF SYSTEMS:    GU Review Male:   Patient reports hard to postpone urination, burning/ pain with urination, and get up at night to urinate. Patient denies frequent urination, leakage of urine, stream starts and stops, trouble starting your stream, have to  strain to urinate , erection problems, and penile pain.  Gastrointestinal (Upper):   Patient reports nausea, vomiting, and indigestion/ heartburn.   Gastrointestinal (Lower):   Patient reports constipation. Patient denies diarrhea.  Constitutional:   Patient denies fever, night sweats, weight loss, and fatigue.  Skin:   Patient reports itching. Patient denies skin rash/ lesion.  Eyes:   Patient reports blurred vision and double vision.   Ears/ Nose/ Throat:   Patient denies  sore throat and sinus problems.  Hematologic/Lymphatic:   Patient reports easy bruising. Patient denies swollen glands.  Cardiovascular:   Patient denies leg swelling and chest pains.  Respiratory:   Patient denies cough and shortness of breath.  Endocrine:   Patient reports excessive thirst.   Musculoskeletal:   Patient denies back pain and joint pain.  Neurological:   Patient reports headaches and dizziness.   Psychologic:   Patient denies depression and anxiety.   VITAL SIGNS:      07/27/2018 09:51 AM  Weight 152 lb / 68.95 kg  Height 67 in / 170.18 cm  BP 106/56 mmHg  Pulse 59 /min  Temperature 98.1 F / 36.7 C  BMI 23.8 kg/m   MULTI-SYSTEM PHYSICAL EXAMINATION:    Constitutional: Well-nourished. No physical deformities. Normally developed. Good grooming.  Neck: Neck symmetrical, not swollen. Normal tracheal position.  Respiratory: Normal breath sounds. No labored breathing, no use of accessory muscles.   Cardiovascular: Regular rate and rhythm. No murmur, no gallop.   Lymphatic: No enlargement of neck, axillae, groin.  Skin: No paleness, no jaundice, no cyanosis. No lesion, no ulcer, no rash.  Neurologic / Psychiatric: Oriented to time, oriented to place, oriented to person. No depression, no anxiety, no agitation.  Gastrointestinal: Abdominal tenderness in the right flank and lower quadrant. No mass, no rigidity, non obese abdomen.   Musculoskeletal: Normal gait and station of head and neck.     PAST DATA REVIEWED:  Source Of History:  Patient  Records Review:   Previous Hospital Records  Urine Test Review:   Urinalysis  X-Ray Review: KUB: Reviewed Films. Discussed With Patient.  [facility].T[employer] [facility] Protocol: Reviewed Report. Discussed With Patient.     01/23/09 07/15/08 04/26/08 01/22/08 11/13/07 05/29/07 08/01/04  PSA  Total PSA 3.77  4.18  4.90  4.90  4.15  4.20  5.53   Free PSA      1.26  1.05   % Free PSA      30.0  19.0     08/01/04  Hormones  Testosterone, Total 3.43     Notes:                     I have reviewed his records from [facility].    PROCEDURES:         KUB - [id number]  A single view of the abdomen is obtained. There is a 9x22m right distal stone. NO other stones are clearly seen. No signficant bone, gas or soft tissue abnormalities noted.       Patient confirmed No Neulasta OnPro Device.           Urinalysis w/Scope Dipstick Dipstick Cont'd Micro  Color: Amber Bilirubin: Neg mg/dL WBC/hpf: 0 - 5/hpf  Appearance: Cloudy Ketones: Neg mg/dL RBC/hpf: >60/hpf  Specific Gravity: 1.025 Blood: 3+ ery/uL Bacteria: Few (10-25/hpf)  pH: 6.0 Protein: 1+ mg/dL Cystals: NS (Not Seen)  Glucose: Neg mg/dL Urobilinogen: 0.2 mg/dL Casts: NS (Not Seen)    Nitrites: Neg Trichomonas: Not Present  Leukocyte Esterase: Trace leu/uL Mucous: Not Present      Epithelial Cells: NS (Not Seen)      Yeast: NS (Not Seen)      Sperm: Not Present    ASSESSMENT:      ICD-10 Details  1 GU:   Ureteral calculus - N20.1 I discussed his options including MET, URS and ESWL and will set him up for ESWL today. I reviewed the risks of ESWL including bleeding, infection, injury to the kidney or adjacent structures, failure to fragment the stone, need for ancillary procedures, thrombotic events, cardiac arrhythmias and sedation complications.   2   Prostate Cancer - C61 I will request PSA's from his PCP and will get one done today.    PLAN:           Orders Labs PSA with Reflex  X-Rays: KUB          Schedule Return Visit/Planned Activity: ASAP - Schedule Surgery          Document Letter(s):  Created for Patient: Clinical Summary

## 2018-07-28 ENCOUNTER — Encounter (HOSPITAL_COMMUNITY): Payer: Self-pay | Admitting: Urology

## 2018-08-20 ENCOUNTER — Other Ambulatory Visit: Payer: Self-pay | Admitting: Urology

## 2018-08-24 ENCOUNTER — Encounter (HOSPITAL_COMMUNITY)
Admission: RE | Disposition: A | Discharge status: Home / Self Care | Payer: Self-pay | Source: Home / Self Care | Attending: Urology

## 2018-08-24 ENCOUNTER — Ambulatory Visit (HOSPITAL_COMMUNITY)
Admission: RE | Admit: 2018-08-24 | Discharge: 2018-08-24 | Disposition: A | Discharge status: Home / Self Care | Payer: Medicare Other | Attending: Urology | Admitting: Urology

## 2018-08-24 ENCOUNTER — Encounter (HOSPITAL_COMMUNITY): Payer: Self-pay | Admitting: *Deleted

## 2018-08-24 ENCOUNTER — Other Ambulatory Visit: Payer: Self-pay

## 2018-08-24 ENCOUNTER — Ambulatory Visit (HOSPITAL_COMMUNITY): Payer: Medicare Other

## 2018-08-24 DIAGNOSIS — N201 Calculus of ureter: Secondary | ICD-10-CM

## 2018-08-24 DIAGNOSIS — Z8546 Personal history of malignant neoplasm of prostate: Secondary | ICD-10-CM | POA: Insufficient documentation

## 2018-08-24 DIAGNOSIS — N200 Calculus of kidney: Secondary | ICD-10-CM | POA: Insufficient documentation

## 2018-08-24 HISTORY — PX: EXTRACORPOREAL SHOCK WAVE LITHOTRIPSY: SHX1557

## 2018-08-24 SURGERY — LITHOTRIPSY, ESWL
Anesthesia: LOCAL | Laterality: Right

## 2018-08-24 MED ORDER — DIPHENHYDRAMINE HCL 25 MG PO CAPS
25.0000 mg | ORAL_CAPSULE | ORAL | Status: AC
  Administered 2018-08-24: 25 mg via ORAL
  Filled 2018-08-24: qty 1

## 2018-08-24 MED ORDER — DIAZEPAM 5 MG PO TABS
10.0000 mg | ORAL_TABLET | ORAL | Status: AC
  Administered 2018-08-24: 10 mg via ORAL
  Filled 2018-08-24: qty 2

## 2018-08-24 MED ORDER — CIPROFLOXACIN HCL 500 MG PO TABS
500.0000 mg | ORAL_TABLET | ORAL | Status: AC
  Administered 2018-08-24: 500 mg via ORAL
  Filled 2018-08-24: qty 1

## 2018-08-24 MED ORDER — SODIUM CHLORIDE 0.9 % IV SOLN
INTRAVENOUS | Status: DC
  Administered 2018-08-24: 10:00:00 via INTRAVENOUS

## 2018-08-24 NOTE — Discharge Instructions (Signed)
See [employer] instructions for care after lithotripsy

## 2018-08-24 NOTE — H&P (Signed)
See scanned H&P

## 2018-08-24 NOTE — Op Note (Signed)
See [facility] OP note scanned into chart. Also because of the size, density, location and other factors that cannot be anticipated I feel this will likely be a staged procedure. This fact supersedes any indication in the scanned [facility] operative note to the contrary.  

## 2018-08-25 ENCOUNTER — Encounter (HOSPITAL_COMMUNITY): Payer: Self-pay | Admitting: Urology

## 2018-09-12 ENCOUNTER — Emergency Department
Admission: EM | Admit: 2018-09-12 | Discharge: 2018-09-12 | Disposition: A | Discharge status: Home / Self Care | Payer: Medicare Other | Source: Home / Self Care | Attending: Family Medicine | Admitting: Family Medicine

## 2018-09-12 ENCOUNTER — Other Ambulatory Visit: Payer: Self-pay

## 2018-09-12 DIAGNOSIS — R053 Chronic cough: Secondary | ICD-10-CM

## 2018-09-12 DIAGNOSIS — R05 Cough: Secondary | ICD-10-CM

## 2018-09-12 DIAGNOSIS — J3089 Other allergic rhinitis: Secondary | ICD-10-CM

## 2018-09-12 MED ORDER — DOXYCYCLINE HYCLATE 100 MG PO CAPS: 100.0000 mg | ORAL_CAPSULE | Freq: Two times a day (BID) | ORAL | 0 refills | Status: DC

## 2018-09-12 MED ORDER — PREDNISONE 20 MG PO TABS: ORAL_TABLET | ORAL | 0 refills | Status: DC

## 2018-09-12 NOTE — ED Provider Notes (Signed)
[facility]    CSN: [id number] Arrival date & time: 09/12/18  1104     History   Chief Complaint Chief Complaint  Patient presents with  . Cough    HPI Jack Howard is a 83 y.o. male.   Patient has seasonal and perennial allergies, and complains of a non-productive cough that has persisted for about 3 months.  He feels well without shortness of breath, wheezing, pleuritic pain, fatigue, or fever.  He attributes his cough to both allergies and his GERD for which he takes Nexium BID, and Zantac at bedtime.  The history is provided by the patient.    Past Medical History:  Diagnosis Date  . Anxiety   . Dementia ([place]) 07/04/2016   new diagnosis of early stages of alzheimers dementia 3 weeks ago   . GERD (gastroesophageal reflux disease)   . Insomnia   . Left rotator cuff tear 05/21/2011  . Malignant melanoma ([place])   . Prostate cancer [facility])     Patient Active Problem List   Diagnosis Date Noted  . Left rotator cuff tear 05/21/2011  . UNSPECIFIED SUDDEN HEARING LOSS 01/06/2011  . PROSTATE CANCER 04/15/2008  . HYPOGLYCEMIA 04/15/2008  . ANXIETY 04/15/2008  . BRADYCARDIA 04/15/2008  . ESOPHAGEAL REFLUX 04/15/2008  . INSOMNIA 04/15/2008  . COUGH 04/15/2008  . CHEST PAIN, RECURRENT 04/15/2008  . MALIGNANT MELANOMA, HX OF 04/15/2008  . BENIGN PROSTATIC HYPERTROPHY, HX OF 04/15/2008    Past Surgical History:  Procedure Laterality Date  . EXTRACORPOREAL SHOCK WAVE LITHOTRIPSY Right 07/27/2018   Procedure: EXTRACORPOREAL SHOCK WAVE LITHOTRIPSY (ESWL);  Surgeon: Irine Seal, MD;  Location: [facility] ORS;  Service: Urology;  Laterality: Right;  . EXTRACORPOREAL SHOCK WAVE LITHOTRIPSY Right 08/24/2018   Procedure: EXTRACORPOREAL SHOCK WAVE LITHOTRIPSY (ESWL);  Surgeon: Lucas Mallow, MD;  Location: [facility] ORS;  Service: Urology;  Laterality: Right;  . EYE SURGERY     both cataracts  . HEMORRHOID SURGERY    . KNEE SURGERY    . PROSTATE SURGERY    . ROTATOR CUFF  REPAIR  05/21/2011  . TONSILLECTOMY         Home Medications    Prior to Admission medications   Medication Sig Start Date End Date Taking? Authorizing Provider  citalopram (CELEXA) 20 MG tablet Take 20 mg by mouth daily.      [provider]  doxycycline (VIBRAMYCIN) 100 MG capsule Take 1 capsule (100 mg total) by mouth 2 (two) times daily. Take with food (Rx void after 09/20/18) 09/12/18   Kandra Nicolas, MD  HYDROcodone-acetaminophen (NORCO) 5-325 MG tablet Take 1 tablet by mouth every 6 (six) hours as needed for moderate pain. 07/27/18   Irine Seal, MD  Memantine HCl (NAMENDA PO) Take by mouth.    [provider]  omeprazole (PRILOSEC) 20 MG capsule Take 20 mg by mouth daily.      [provider]  ondansetron (ZOFRAN) 4 MG tablet Take 1 tablet (4 mg total) by mouth every 8 (eight) hours as needed for nausea or vomiting. 07/27/18 07/27/19  Irine Seal, MD  predniSONE (DELTASONE) 20 MG tablet Take one tab by mouth twice daily for 4 days, then one daily for 3 days. Take with food. 09/12/18   Kandra Nicolas, MD  tamsulosin (FLOMAX) 0.4 MG CAPS capsule Take 1 capsule (0.4 mg total) by mouth daily. 07/27/18   Irine Seal, MD  zolpidem (AMBIEN) 5 MG tablet Take 5 mg by mouth at bedtime as needed  for sleep.    [provider]    Family History Family History  Problem Relation Age of Onset  . Breast cancer Mother   . Prostate cancer Father   . Leukemia Brother     Social History Social History   Tobacco Use  . Smoking status: Never Smoker  . Smokeless tobacco: Never Used  Substance Use Topics  . Alcohol use: No  . Drug use: No     Allergies   Patient has no known allergies.   Review of Systems Review of Systems No sore throat + cough No pleuritic pain No wheezing No nasal congestion No post-nasal drainage No sinus pain/pressure No itchy/red eyes No earache No hemoptysis No SOB No fever/chills No nausea No vomiting No abdominal  pain No diarrhea No urinary symptoms No skin rash No fatigue No myalgias No headache Used OTC meds without relief   Physical Exam Triage Vital Signs ED Triage Vitals  Enc Vitals Group     BP 09/12/18 1124 110/62     Pulse Rate 09/12/18 1124 65     Resp 09/12/18 1124 16     Temp 09/12/18 1124 (!) 97.4 F (36.3 C)     Temp Source 09/12/18 1124 Oral     SpO2 09/12/18 1124 98 %     Weight 09/12/18 1125 151 lb (68.5 kg)     Height 09/12/18 1125 5\' 7"  (1.702 m)     Head Circumference --      Peak Flow --      Pain Score 09/12/18 1125 0     Pain Loc --      Pain Edu? --      Excl. in [place]? --    No data found.  Updated Vital Signs BP 110/62 (BP Location: Right Arm)   Pulse 65   Temp (!) 97.4 F (36.3 C) (Oral)   Resp 16   Ht 5\' 7"  (1.702 m)   Wt 68.5 kg   SpO2 98%   BMI 23.65 kg/m   Visual Acuity Right Eye Distance:   Left Eye Distance:   Bilateral Distance:    Right Eye Near:   Left Eye Near:    Bilateral Near:     Physical Exam Nursing notes and Vital Signs reviewed. Appearance:  Patient appears stated age, and in no acute distress Eyes:  Pupils are equal, round, and reactive to light and accomodation.  Extraocular movement is intact.  Conjunctivae are not inflamed  Ears:  Canals normal.  Tympanic membranes normal.  Nose:  Mildly congested turbinates.  No sinus tenderness.   Pharynx:  Normal Neck:  Supple.  No adenopathy. Lungs:  Clear to auscultation.  Breath sounds are equal.  Moving air well. Heart:  Regular rate and rhythm without murmurs, rubs, or gallops.  Abdomen:  Nontender without masses or hepatosplenomegaly.  Bowel sounds are present.  No CVA or flank tenderness.  Extremities:  No edema.  Skin:  No rash present.    UC Treatments / Results  Labs (all labs ordered are listed, but only abnormal results are displayed) Labs Reviewed - No data to display  EKG None  Radiology No results found.  Procedures Procedures (including critical care  time)  Medications Ordered in UC Medications - No data to display  Initial Impression / Assessment and Plan / UC Course  I have reviewed the triage vital signs and the nursing notes.  Pertinent labs & imaging results that were available during my care of the patient were  reviewed by me and considered in my medical decision making (see chart for details).    Suspect combination of allergic rhinitis and GERD. Begin prednisone burst/taper. Followup with Family Doctor if not improved in about 2 weeks.   Final Clinical Impressions(s) / UC Diagnoses   Final diagnoses:  Persistent cough for 3 weeks or longer  Perennial allergic rhinitis     Discharge Instructions     Take plain guaifenesin (1200mg  extended release tabs such as Mucinex) twice daily, with plenty of water, for cough and congestion.  Get adequate rest.   Continue Flonase nasal spray.  May continue Claritin. May take Delsym Cough Suppressant at bedtime for nighttime cough.  Begin Doxycycline if not improving about one week or if persistent fever develops      ED Prescriptions    Medication Sig Dispense Auth. Provider   predniSONE (DELTASONE) 20 MG tablet Take one tab by mouth twice daily for 4 days, then one daily for 3 days. Take with food. 11 tablet Kandra Nicolas, MD   doxycycline (VIBRAMYCIN) 100 MG capsule Take 1 capsule (100 mg total) by mouth 2 (two) times daily. Take with food (Rx void after 09/20/18) 14 capsule Kandra Nicolas, MD         Kandra Nicolas, MD 09/12/18 [phone]

## 2018-09-12 NOTE — Discharge Instructions (Addendum)
Take plain guaifenesin (1200mg  extended release tabs such as Mucinex) twice daily, with plenty of water, for cough and congestion.  Get adequate rest.   Continue Flonase nasal spray.  May continue Claritin. May take Delsym Cough Suppressant at bedtime for nighttime cough.  Begin Doxycycline if not improving about one week or if persistent fever develops

## 2018-09-12 NOTE — ED Triage Notes (Signed)
Pt c/o cough due to seasonal allergies. Says he's taken every OTC med you can try with no relief.

## 2018-09-24 ENCOUNTER — Institutional Professional Consult (permissible substitution): Payer: Medicare Other | Admitting: Internal Medicine

## 2018-09-24 ENCOUNTER — Other Ambulatory Visit: Payer: Self-pay

## 2018-09-24 ENCOUNTER — Encounter: Payer: Self-pay | Admitting: Internal Medicine

## 2018-09-24 ENCOUNTER — Ambulatory Visit: Payer: Medicare Other | Admitting: Internal Medicine

## 2018-09-24 DIAGNOSIS — R059 Cough, unspecified: Secondary | ICD-10-CM

## 2018-09-24 DIAGNOSIS — R058 Other specified cough: Secondary | ICD-10-CM

## 2018-09-24 DIAGNOSIS — R05 Cough: Secondary | ICD-10-CM

## 2018-09-24 LAB — CBC WITH DIFFERENTIAL/PLATELET
Basophils Absolute: 0 10*3/uL (ref 0.0–0.1)
Basophils Relative: 0.3 % (ref 0.0–3.0)
Eosinophils Absolute: 0 10*3/uL (ref 0.0–0.7)
Eosinophils Relative: 0.4 % (ref 0.0–5.0)
HCT: 37.7 % — ABNORMAL LOW (ref 39.0–52.0)
Hemoglobin: 12.8 g/dL — ABNORMAL LOW (ref 13.0–17.0)
Lymphocytes Relative: 24.9 % (ref 12.0–46.0)
Lymphs Abs: 2.9 10*3/uL (ref 0.7–4.0)
MCHC: 33.9 g/dL (ref 30.0–36.0)
MCV: 92.6 fl (ref 78.0–100.0)
Monocytes Absolute: 0.9 10*3/uL (ref 0.1–1.0)
Monocytes Relative: 7.5 % (ref 3.0–12.0)
Neutro Abs: 7.7 10*3/uL (ref 1.4–7.7)
Neutrophils Relative %: 66.9 % (ref 43.0–77.0)
Platelets: 181 10*3/uL (ref 150.0–400.0)
RBC: 4.07 Mil/uL — ABNORMAL LOW (ref 4.22–5.81)
RDW: 13.6 % (ref 11.5–15.5)
WBC: 11.5 10*3/uL — ABNORMAL HIGH (ref 4.0–10.5)

## 2018-09-24 MED ORDER — RABEPRAZOLE SODIUM 20 MG PO TBEC: DELAYED_RELEASE_TABLET | ORAL | 2 refills | Status: AC

## 2018-09-24 NOTE — Progress Notes (Signed)
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 with severe GERD "all my life" first aware of it around age 83 c/b Barrett's per GI notes with severe hb with vomiting /cough Jack Howard referred to pulmonary clinic 09/24/2018 by Dr   Jack Howard with cough worse x 2018  But esp severe since early 2019      History of Present Illness  09/24/2018  Pulmonary/ 1st office eval/Jack Howard  Chief Complaint  Patient presents with   Cough  Dyspnea:  Not limited by breathing from desired activities   Cough: onset daily typically starts while in  bathroom no excess mucus / no vomiting now  Sleep: props head up but bead is flat/ sleeps fine most nights  SABA use: none No response to prednisone, pantoprazole, cough drops/ cough meds  Overt hb  New dog 2018  Overt hb despite prilosec daily - intol to many ppi's with GI complaints, has never tried aciphex to his knowledge     Kouffman Reflux v Neurogenic Cough Differentiator Reflux Comments  Do you awaken from a sound sleep coughing violently?                            With trouble breathing? No No but coughs till dizzy   Do you have choking episodes when you cannot  Get enough air, gasping for air ?              no   Do you usually cough when you lie down into  The bed, or when you just lie down to rest ?                          no   Do you usually cough after meals or eating?         no   Do you cough when (or after) you bend over?    no   GERD SCORE     Kouffman Reflux v Neurogenic Cough Differentiator Neurogenic   Do you more-or-less cough all day long? All day   Does change of temperature make you cough? no   Does laughing or chuckling cause you to cough? No    Do fumes (perfume, automobile fumes, burned  Toast, etc.,) cause you to cough ?      no   Does speaking, singing, or talking on the phone cause you to cough   ?               No  Worse since stopped  preaching    Neurogenic/Airway score      No obvious other patterns in  day to day or daytime variability or assoc excess/ purulent sputum or mucus plugs or hemoptysis or cp or chest tightness, subjective wheeze or overt sinus  symptoms.   Sleeping  without nocturnal   exacerbation  of respiratory  c/o's or need for noct saba. Also denies any obvious fluctuation of symptoms with weather or environmental changes or other aggravating or alleviating factors except as outlined above   No unusual exposure hx or h/o childhood pna/ asthma or knowledge of premature birth.  Current Allergies, Complete Past Medical History, Past Surgical History, Family History, and Social History were reviewed in [employer] record.  ROS  The following are not active complaints unless bolded Hoarseness,  sore throat, dysphagia, dental problems, itching eyes, sneezing,  nasal congestion or discharge of excess mucus or purulent secretions, ear ache,   fever, chills, sweats, unintended wt loss or wt gain, classically pleuritic or exertional cp,  orthopnea pnd or arm/hand swelling  or leg swelling, presyncope, palpitations, abdominal pain, anorexia, nausea, vomiting, diarrhea  or change in bowel habits or change in bladder habits, change in stools or change in urine, dysuria, hematuria,  rash, arthralgias, visual complaints, headache, numbness, weakness or ataxia or problems with walking or coordination,  change in mood or  memory.            Past Medical History:  Diagnosis Date   Anxiety    Dementia ([place]) 07/04/2016   new diagnosis of early stages of alzheimers dementia 3 weeks ago    GERD (gastroesophageal reflux disease)    Insomnia    Left rotator cuff tear 05/21/2011   Malignant melanoma ([place])    Prostate cancer ([place])     Outpatient Medications Prior to Visit  Medication Sig Dispense Refill   citalopram (CELEXA) 20 MG tablet Take 20 mg by mouth daily.          0   Memantine  HCl (NAMENDA PO) Take by mouth.     omeprazole (PRILOSEC) 20 MG capsule Take 20 mg by mouth daily.       ondansetron (ZOFRAN) 4 MG tablet Take 1 tablet (4 mg total) by mouth every 8 (eight) hours as needed for nausea or vomiting. 12 tablet 1   tamsulosin (FLOMAX) 0.4 MG CAPS capsule Take 1 capsule (0.4 mg total) by mouth daily. 30 capsule 1   zolpidem (AMBIEN) 5 MG tablet Take 5 mg by mouth at bedtime as needed for sleep.         0       0      Objective:     BP 120/70 (BP Location: Left Arm, Patient Position: Sitting, Cuff Size: Normal)    Pulse 64    Temp 98.2 F (36.8 C)    Ht 5\' 7"  (1.702 m)    Wt 155 lb 12.8 oz (70.7 kg)    SpO2 97%    BMI 24.40 kg/m   SpO2: 97 %  RA  Pleasant wm nad with good sense of humor/ nad  HEENT: nl dentition, turbinates bilaterally, and oropharynx. Nl external ear canals without cough reflex   NECK :  without JVD/Nodes/TM/ nl carotid upstrokes bilaterally   LUNGS: no acc muscle use,  Nl contour chest which is clear to A and P bilaterally without cough on insp or exp maneuvers   CV:  RRR  no s3 or murmur or increase in P2, and no edema   ABD:  soft and nontender with nl inspiratory excursion in the supine position. No bruits or organomegaly appreciated, bowel sounds nl  MS:  Nl gait/ ext warm without deformities, calf tenderness, cyanosis or clubbing No obvious joint restrictions   SKIN: warm and dry without lesions    NEURO:  alert, approp, nl sensorium with  no motor or cerebellar deficits apparent.     Labs ordered 09/24/2018  Allergy profile   I personally reviewed images and agree with radiology impression as follows:  CXR:   07/02/18  No active cardiopulmonary disease.     Assessment   Upper airway cough syndrome Onset age 25 assoc with severe gerd/ worse x 2018 (about the time he got a dog) - DgEs 05/14/17 Incomplete primary esophageal peristalsis was  observed. There is relative thickening of the mucosal folds in the distal  esophagus suggesting esophagitis. No esophageal mass or stricture. No spontaneous gastroesophageal reflux was observed  during the exam. Moderate sized fixed hiatal hernia. - Allergy profile 09/24/2018 >  Eos 0.4 /  IgE  pending - max gerd rx / bed blocks rec 09/24/2018    ddx with most likely suspects bolded The most common causes of chronic cough in immunocompetent adults include the following: upper airway cough syndrome (UACS), previously referred to as postnasal drip syndrome (PNDS), which is caused by variety of rhinosinus conditions; (2) asthma; (3) GERD; (4) chronic bronchitis from cigarette smoking or other inhaled environmental irritants; (5) nonasthmatic eosinophilic bronchitis; and (6) bronchiectasis.   These conditions, singly or in combination, have accounted for up to 94% of the causes of chronic cough in prospective studies.   Other conditions have constituted no >6% of the causes in prospective studies These have included bronchogenic carcinoma, chronic interstitial pneumonia, sarcoidosis, left ventricular failure, ACEI-induced cough, and aspiration from a condition associated with pharyngeal dysfunction.    Chronic cough is often simultaneously caused by more than one condition. A single cause has been found from 38 to 82% of the time, multiple causes from 18 to 62%. Multiply caused cough has been the result of three diseases up to 42% of the time.      Most likely this is Upper airway cough syndrome (previously labeled PNDS),  is so named because it's frequently impossible to sort out how much is  CR/sinusitis with freq throat clearing (which can be related to primary GERD)   vs  causing  secondary (" extra esophageal")  GERD from wide swings in gastric pressure that occur with throat clearing, often  promoting self use of mint and menthol lozenges that reduce the lower esophageal sphincter tone and exacerbate the problem further in a cyclical fashion.   These are the same pts (now  being labeled as having "irritable larynx syndrome" by some cough centers) who not infrequently have a history of having failed to tolerate ace inhibitors,  dry powder inhalers or biphosphonates or report having atypical/extraesophageal reflux symptoms that don't respond to standard doses of PPI  and are easily confused as having aecopd or asthma flares by even experienced allergists/ pulmonologists (myself included).   Of the three most common causes of  Sub-acute / recurrent or chronic cough, only one (GERD)  can actually contribute to/ trigger  the other two (asthma and post nasal drip syndrome)  and perpetuate the cylce of cough.  While not intuitively obvious, many patients with chronic low grade reflux do not cough until there is a primary insult that disturbs the protective epithelial barrier and exposes sensitive nerve endings.   This is typically viral but can due to [place]  may apply here (? From allergic rhinitis)    >>>  The point is that once this occurs, it is difficult to eliminate the cycle  using anything but a maximally effective acid suppression regimen at least in the short run, accompanied by an appropriate diet to address non acid GERD  And then consider adding 1st gen H1 blockers per guidelines  If tolerated pending f/u allergy testing above    Advised:  The standardized cough guidelines published in Chest by Lissa Morales in 2006 are still the best available and consist of a multiple step process (up to 12!) , not a single office visit,  and are intended  to address  this problem logically,  with an alogrithm dependent on response to empiric treatment at  each progressive step  to determine a specific diagnosis with  minimal addtional testing needed. Therefore if adherence is an issue or can't be accurately verified,  it's very unlikely the standard evaluation and treatment will be successful here.    Furthermore, response to therapy (other than acute cough suppression,  which should only be used short term with avoidance of narcotic containing cough syrups if possible), can be a gradual process for which the patient is not likely to  perceive immediate benefit.  Unlike going to an eye doctor where the best perscription is almost always the first one and is immediately effective, this is almost never the case in the management of chronic cough syndromes. Therefore the patient needs to commit up front to consistently adhere to recommendations  for up to 6 weeks of therapy directed at the likely underlying problem(s) before the response can be reasonably evaluated.     >>>> f/u 6 weeks with all meds in hand using a trust but verify approach to confirm accurate Medication  Reconciliation The principal here is that until we are certain that the  patients are doing what we've asked, it makes no sense to ask them to do more.    Total time devoted to counseling  > 50 % of initial 60 min office visit:  review case with pt/ discussion of options/alternatives/ personally creating written customized instructions  in presence of pt  then going over those specific  Instructions directly with the pt including how to use all of the meds but in particular covering each new medication in detail and the difference between the maintenance= "automatic" meds and the prns using an action plan format for the latter (If this problem/symptom => do that organization reading Left to right).  Please see AVS from this visit for a full list of these instructions which I personally wrote for this pt and  are unique to this visit.       Christinia Gully, MD 09/24/2018

## 2018-09-24 NOTE — Patient Instructions (Addendum)
Finish prilosec   Start Aciphex 20 mg Take 30- 60 min before your first and last meals of the day   GERD (REFLUX)  is an extremely common cause of respiratory symptoms just like yours , many times with no obvious heartburn at all.    It can be treated with medication, but also with lifestyle changes including elevation of the head of your bed (ideally with 6 -8inch blocks under the headboard of your bed),  Smoking cessation, avoidance of late meals, excessive alcohol, and avoid fatty foods, chocolate, peppermint, colas, red wine, and acidic juices such as orange juice.  NO MINT OR MENTHOL PRODUCTS SO NO COUGH DROPS  USE SUGARLESS CANDY INSTEAD (Jolley ranchers or Stover's or Life Savers) or even ice chips will also do - the key is to swallow to prevent all throat clearing. NO OIL BASED VITAMINS - use powdered substitutes.  Avoid fish oil when coughing.    Please remember to go to the lab department   for your tests - we will call you with the results when they are available.      Please schedule a follow up office visit in 6 weeks, call sooner if needed with all medications /inhalers/ solutions in hand so we can verify exactly what you are taking. This includes all medications from all doctors and over the counters

## 2018-09-25 ENCOUNTER — Encounter: Payer: Self-pay | Admitting: Internal Medicine

## 2018-09-25 LAB — RESPIRATORY ALLERGY PROFILE REGION II ~~LOC~~

## 2018-09-25 LAB — INTERPRETATION:

## 2018-09-25 NOTE — Assessment & Plan Note (Addendum)
Onset age 83 28 with severe gerd/ worse x 2018 (about the time he got a dog) - DgEs 05/14/17 Incomplete primary esophageal peristalsis was observed. There is relative thickening of the mucosal folds in the distal esophagus suggesting esophagitis. No esophageal mass or stricture. No spontaneous gastroesophageal reflux was observed  during the exam. Moderate sized fixed hiatal hernia. - Allergy profile 09/24/2018 >  Eos 0.4 /  IgE  pending - max gerd rx / bed blocks rec 09/24/2018   The most common causes of chronic cough in immunocompetent adults include the following: upper airway cough syndrome (UACS), previously referred to as postnasal drip syndrome (PNDS), which is caused by variety of rhinosinus conditions; (2) asthma; (3) GERD; (4) chronic bronchitis from cigarette smoking or other inhaled environmental irritants; (5) nonasthmatic eosinophilic bronchitis; and (6) bronchiectasis.   These conditions, singly or in combination, have accounted for up to 94% of the causes of chronic cough in prospective studies.   Other conditions have constituted no >6% of the causes in prospective studies These have included bronchogenic carcinoma, chronic interstitial pneumonia, sarcoidosis, left ventricular failure, ACEI-induced cough, and aspiration from a condition associated with pharyngeal dysfunction.    Chronic cough is often simultaneously caused by more than one condition. A single cause has been found from 38 to 82% of the time, multiple causes from 18 to 62%. Multiply caused cough has been the result of three diseases up to 42% of the time.      Most likely this is Upper airway cough syndrome (previously labeled PNDS),  is so named because it's frequently impossible to sort out how much is  CR/sinusitis with freq throat clearing (which can be related to primary GERD)   vs  causing  secondary (" extra esophageal")  GERD from wide swings in gastric pressure that occur with throat clearing, often  promoting  self use of mint and menthol lozenges that reduce the lower esophageal sphincter tone and exacerbate the problem further in a cyclical fashion.   These are the same pts (now being labeled as having "irritable larynx syndrome" by some cough centers) who not infrequently have a history of having failed to tolerate ace inhibitors,  dry powder inhalers or biphosphonates or report having atypical/extraesophageal reflux symptoms that don't respond to standard doses of PPI  and are easily confused as having aecopd or asthma flares by even experienced allergists/ pulmonologists (myself included).   Of the three most common causes of  Sub-acute / recurrent or chronic cough, only one (GERD)  can actually contribute to/ trigger  the other two (asthma and post nasal drip syndrome)  and perpetuate the cylce of cough.  While not intuitively obvious, many patients with chronic low grade reflux do not cough until there is a primary insult that disturbs the protective epithelial barrier and exposes sensitive nerve endings.   This is typically viral but can due to [place]  may apply here (? From allergic rhinitis)    >>>  The point is that once this occurs, it is difficult to eliminate the cycle  using anything but a maximally effective acid suppression regimen at least in the short run, accompanied by an appropriate diet to address non acid GERD  And then consider adding 1st gen H1 blockers per guidelines  If tolerated pending f/u allergy testing above    Advised:  The standardized cough guidelines published in Chest by Lissa Morales in 2006 are still the best available and consist of a multiple step  process (up to 12!) , not a single office visit,  and are intended  to address this problem logically,  with an alogrithm dependent on response to empiric treatment at  each progressive step  to determine a specific diagnosis with  minimal addtional testing needed. Therefore if adherence is an issue or can't be  accurately verified,  it's very unlikely the standard evaluation and treatment will be successful here.    Furthermore, response to therapy (other than acute cough suppression, which should only be used short term with avoidance of narcotic containing cough syrups if possible), can be a gradual process for which the patient is not likely to  perceive immediate benefit.  Unlike going to an eye doctor where the best perscription is almost always the first one and is immediately effective, this is almost never the case in the management of chronic cough syndromes. Therefore the patient needs to commit up front to consistently adhere to recommendations  for up to 6 weeks of therapy directed at the likely underlying problem(s) before the response can be reasonably evaluated.     >>>> f/u 6 weeks with all meds in hand using a trust but verify approach to confirm accurate Medication  Reconciliation The principal here is that until we are certain that the  patients are doing what we've asked, it makes no sense to ask them to do more.    Total time devoted to counseling  > 50 % of initial 60 min office visit:  review case with pt/ discussion of options/alternatives/ personally creating written customized instructions  in presence of pt  then going over those specific  Instructions directly with the pt including how to use all of the meds but in particular covering each new medication in detail and the difference between the maintenance= "automatic" meds and the prns using an action plan format for the latter (If this problem/symptom => do that organization reading Left to right).  Please see AVS from this visit for a full list of these instructions which I personally wrote for this pt and  are unique to this visit.

## 2018-09-25 NOTE — Progress Notes (Signed)
Spoke with pt and notified of results per Dr. Wert. Pt verbalized understanding and denied any questions. 

## 2018-11-09 ENCOUNTER — Ambulatory Visit: Payer: Medicare Other | Admitting: Internal Medicine

## 2018-11-28 ENCOUNTER — Telehealth: Payer: Self-pay | Admitting: Emergency Medicine

## 2018-11-28 ENCOUNTER — Other Ambulatory Visit: Payer: Self-pay

## 2018-11-28 ENCOUNTER — Encounter: Payer: Self-pay | Admitting: Emergency Medicine

## 2018-11-28 ENCOUNTER — Emergency Department
Admission: EM | Admit: 2018-11-28 | Discharge: 2018-11-28 | Disposition: A | Discharge status: Home / Self Care | Payer: Medicare Other | Source: Home / Self Care

## 2018-11-28 DIAGNOSIS — R1030 Lower abdominal pain, unspecified: Secondary | ICD-10-CM

## 2018-11-28 NOTE — ED Provider Notes (Signed)
[facility]    CSN: [id number] Arrival date & time: 11/28/18  0948     History   Chief Complaint Chief Complaint  Patient presents with  . Abdominal Pain    HPI Jack Howard is a 83 y.o. male.   HPI  Jack Howard is a 83 y.o. male presenting to UC with c/o 3 weeks of lower abdominal pain and pressure. Pain is dull and cramping at times.  He also reports slight increase in belching recently, hx of acid reflux.  Denies fever, chills, n/v/d. Denies constipation. He believes he had diverticulitis many years ago.  He also reports a 7 pound weight loss since the last time he weighed himself but was unsure when that was.  He has not tried anything for his pain. He also has not called his PCP about pain as he states it is very difficult to get an appointment with his PCP. No hx of abdominal surgeries.  Per medical records pt does have a hx of Alzheimer's but still lives and drives independently.   Past Medical History:  Diagnosis Date  . Anxiety   . Dementia ([place]) 07/04/2016   new diagnosis of early stages of alzheimers dementia 3 weeks ago   . GERD (gastroesophageal reflux disease)   . Insomnia   . Left rotator cuff tear 05/21/2011  . Malignant melanoma ([place])   . Prostate cancer [facility])     Patient Active Problem List   Diagnosis Date Noted  . Left rotator cuff tear 05/21/2011  . UNSPECIFIED SUDDEN HEARING LOSS 01/06/2011  . PROSTATE CANCER 04/15/2008  . HYPOGLYCEMIA 04/15/2008  . ANXIETY 04/15/2008  . BRADYCARDIA 04/15/2008  . Esophageal reflux 04/15/2008  . INSOMNIA 04/15/2008  . Upper airway cough syndrome 04/15/2008  . CHEST PAIN, RECURRENT 04/15/2008  . MALIGNANT MELANOMA, HX OF 04/15/2008  . BENIGN PROSTATIC HYPERTROPHY, HX OF 04/15/2008    Past Surgical History:  Procedure Laterality Date  . EXTRACORPOREAL SHOCK WAVE LITHOTRIPSY Right 07/27/2018   Procedure: EXTRACORPOREAL SHOCK WAVE LITHOTRIPSY (ESWL);  Surgeon: Irine Seal, MD;  Location: [facility]  ORS;  Service: Urology;  Laterality: Right;  . EXTRACORPOREAL SHOCK WAVE LITHOTRIPSY Right 08/24/2018   Procedure: EXTRACORPOREAL SHOCK WAVE LITHOTRIPSY (ESWL);  Surgeon: Lucas Mallow, MD;  Location: [facility]S;  Service: Urology;  Laterality: Right;  . EYE SURGERY     both cataracts  . HEMORRHOID SURGERY    . KNEE SURGERY    . PROSTATE SURGERY    . ROTATOR CUFF REPAIR  05/21/2011  . TONSILLECTOMY         Home Medications    Prior to Admission medications   Medication Sig Start Date End Date Taking? Authorizing Provider  citalopram (CELEXA) 20 MG tablet Take 20 mg by mouth daily.      [provider]  RABEprazole (ACIPHEX) 20 MG tablet Take 30- 60 min before your first and last meals of the day 09/24/18   Tanda Rockers, MD  zolpidem (AMBIEN) 5 MG tablet Take 5 mg by mouth at bedtime as needed for sleep.    [provider]    Family History Family History  Problem Relation Age of Onset  . Breast cancer Mother   . Prostate cancer Father   . Leukemia Brother     Social History Social History   Tobacco Use  . Smoking status: Never Smoker  . Smokeless tobacco: Never Used  Substance Use Topics  . Alcohol use: No  . Drug use: No  Allergies   Pantoprazole   Review of Systems Review of Systems  Constitutional: Positive for unexpected weight change. Negative for chills and fever.  HENT: Negative for congestion, ear pain, sore throat, trouble swallowing and voice change.   Respiratory: Negative for cough and shortness of breath.   Cardiovascular: Negative for chest pain and palpitations.  Gastrointestinal: Positive for abdominal pain. Negative for diarrhea, nausea and vomiting.  Genitourinary: Negative for decreased urine volume, dysuria, flank pain, frequency, hematuria and urgency.  Musculoskeletal: Negative for arthralgias, back pain and myalgias.  Skin: Negative for rash.  Neurological: Negative for dizziness, light-headedness and headaches.      Physical Exam Triage Vital Signs ED Triage Vitals  Enc Vitals Group     BP 11/28/18 1011 108/64     Pulse Rate 11/28/18 1011 62     Resp --      Temp 11/28/18 1011 (!) 97.5 F (36.4 C)     Temp Source 11/28/18 1011 Oral     SpO2 11/28/18 1011 98 %     Weight 11/28/18 1013 151 lb 8 oz (68.7 kg)     Height 11/28/18 1013 5\' 7"  (1.702 m)     Head Circumference --      Peak Flow --      Pain Score 11/28/18 1013 5     Pain Loc --      Pain Edu? --      Excl. in [place]? --    No data found.  Updated Vital Signs BP 108/64 (BP Location: Left Arm)   Pulse 62   Temp (!) 97.5 F (36.4 C) (Oral)   Ht 5\' 7"  (1.702 m)   Wt 151 lb 8 oz (68.7 kg)   SpO2 98%   BMI 23.73 kg/m   Visual Acuity Right Eye Distance:   Left Eye Distance:   Bilateral Distance:    Right Eye Near:   Left Eye Near:    Bilateral Near:     Physical Exam Vitals signs and nursing note reviewed.  Constitutional:      General: He is not in acute distress.    Appearance: He is well-developed. He is not ill-appearing, toxic-appearing or diaphoretic.  HENT:     Head: Normocephalic and atraumatic.  Neck:     Musculoskeletal: Normal range of motion.  Cardiovascular:     Rate and Rhythm: Normal rate and regular rhythm.  Pulmonary:     Effort: Pulmonary effort is normal.     Breath sounds: Normal breath sounds.  Abdominal:     General: Bowel sounds are normal. There is no distension.     Palpations: Abdomen is soft.     Tenderness: There is abdominal tenderness in the right lower quadrant, periumbilical area, suprapubic area and left lower quadrant. There is no right CVA tenderness or left CVA tenderness.     Hernia: No hernia is present.  Musculoskeletal: Normal range of motion.  Skin:    General: Skin is warm and dry.  Neurological:     General: No focal deficit present.     Mental Status: He is alert and oriented to person, place, and time.  Psychiatric:        Behavior: Behavior normal.      UC  Treatments / Results  Labs (all labs ordered are listed, but only abnormal results are displayed) Labs Reviewed - No data to display  EKG   Radiology No results found.  Procedures Procedures (including critical care time)  Medications Ordered in UC  Medications - No data to display  Initial Impression / Assessment and Plan / UC Course  I have reviewed the triage vital signs and the nursing notes.  Pertinent labs & imaging results that were available during my care of the patient were reviewed by me and considered in my medical decision making (see chart for details).     Pt c/o 3 weeks of lower abdominal pain with occasional belching, no other symptoms. Given pt's age and hx of suspected diverticulitis and prostate cancer, encouraged pt to go to emergency department for further evaluation as he will benefit from a CT scan, unavailable at this facility at this time.  Pt understanding and agreeable with plan to go to hospital for further evaluation. Pt discharged in good condition.  Final Clinical Impressions(s) / UC Diagnoses   Final diagnoses:  Lower abdominal pain     Discharge Instructions      It is recommended that you are evaluated with further imaging and testing due to your worsening abdominal pain and recent weight loss.  Please drive directly to the emergency department at [facility] for further evaluation and treatment.     ED Prescriptions    None     Controlled Substance Prescriptions [place] Controlled Substance Registry consulted? Not Applicable   Tyrell Antonio 11/28/18 1141

## 2018-11-28 NOTE — Telephone Encounter (Signed)
Left message, checking on pt. Call back with questions/concerns.

## 2018-11-28 NOTE — Discharge Instructions (Signed)
°  It is recommended that you are evaluated with further imaging and testing due to your worsening abdominal pain and recent weight loss.  Please drive directly to the emergency department at [facility] for further evaluation and treatment.

## 2018-11-28 NOTE — ED Triage Notes (Signed)
Patient c/o stomach pain x 3 weeks, lower abdominal pain, acid reflux history, no diarrhea, no constipation, HA, no nausea, no vomiting, no urinary problems

## 2018-11-30 ENCOUNTER — Telehealth: Payer: Self-pay

## 2018-11-30 NOTE — Telephone Encounter (Signed)
Saw MD today having MRI tomorrow.  Will follow up as needed.

## 2019-04-10 IMAGING — DX DG SHOULDER 2+V*R*
3 series · 3 of 3 positions shown · non-contrast
Comparison: None.

CLINICAL DATA: Chronic right shoulder pain for year, recent fall

EXAM:
RIGHT SHOULDER - 2+ VIEW

[dg shoulder right (1 of 3)]
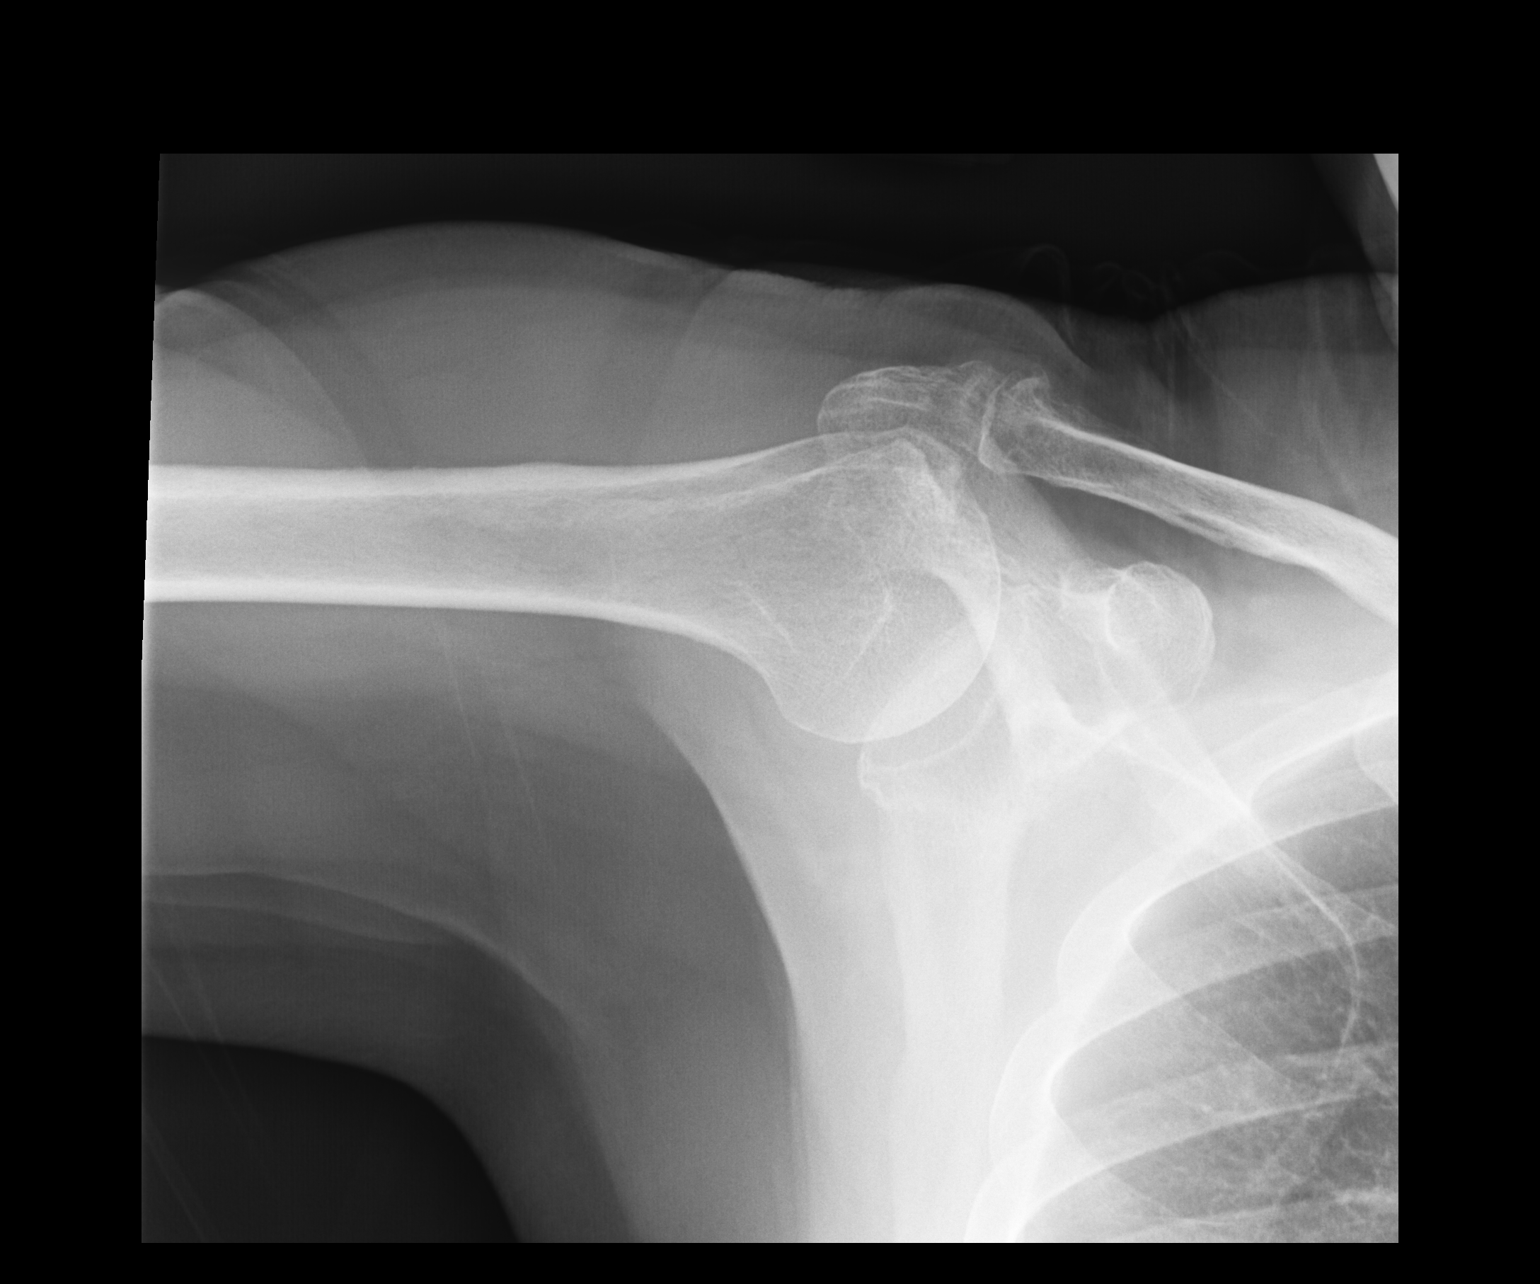

[dg shoulder right (2 of 3)]
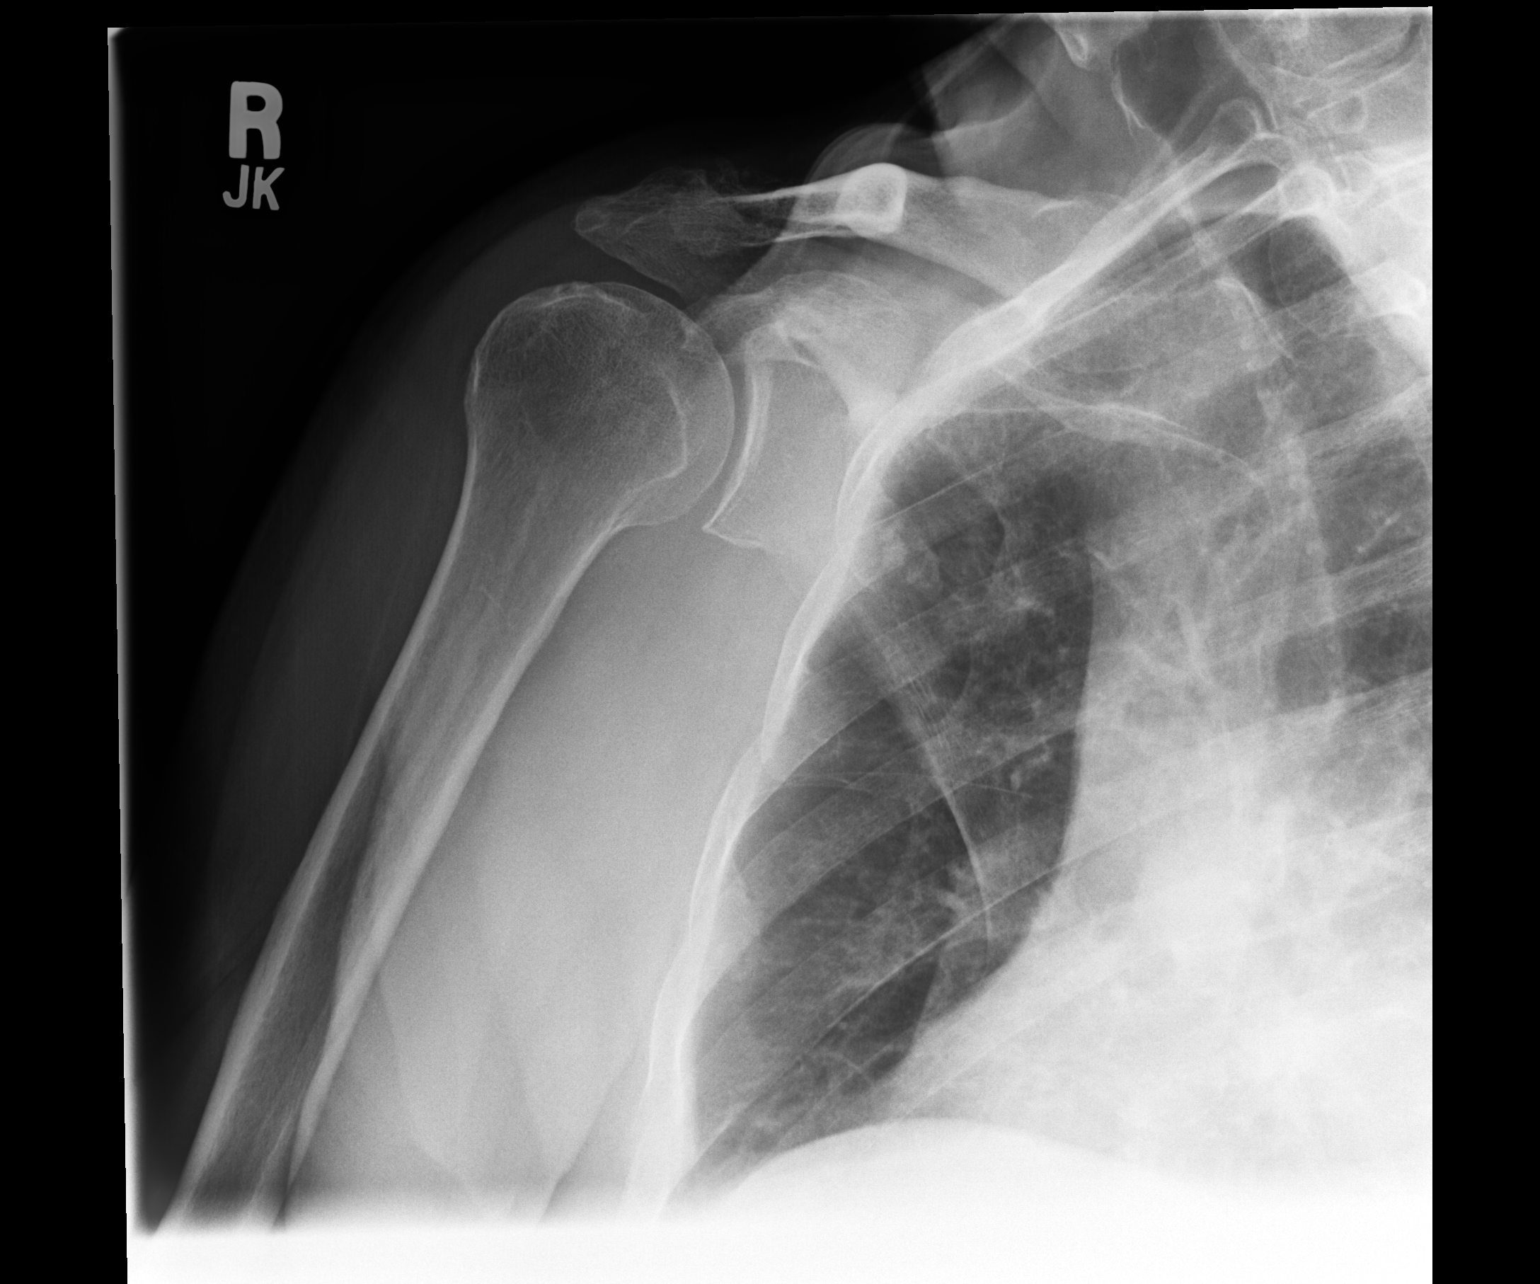

[dg shoulder right (3 of 3)]
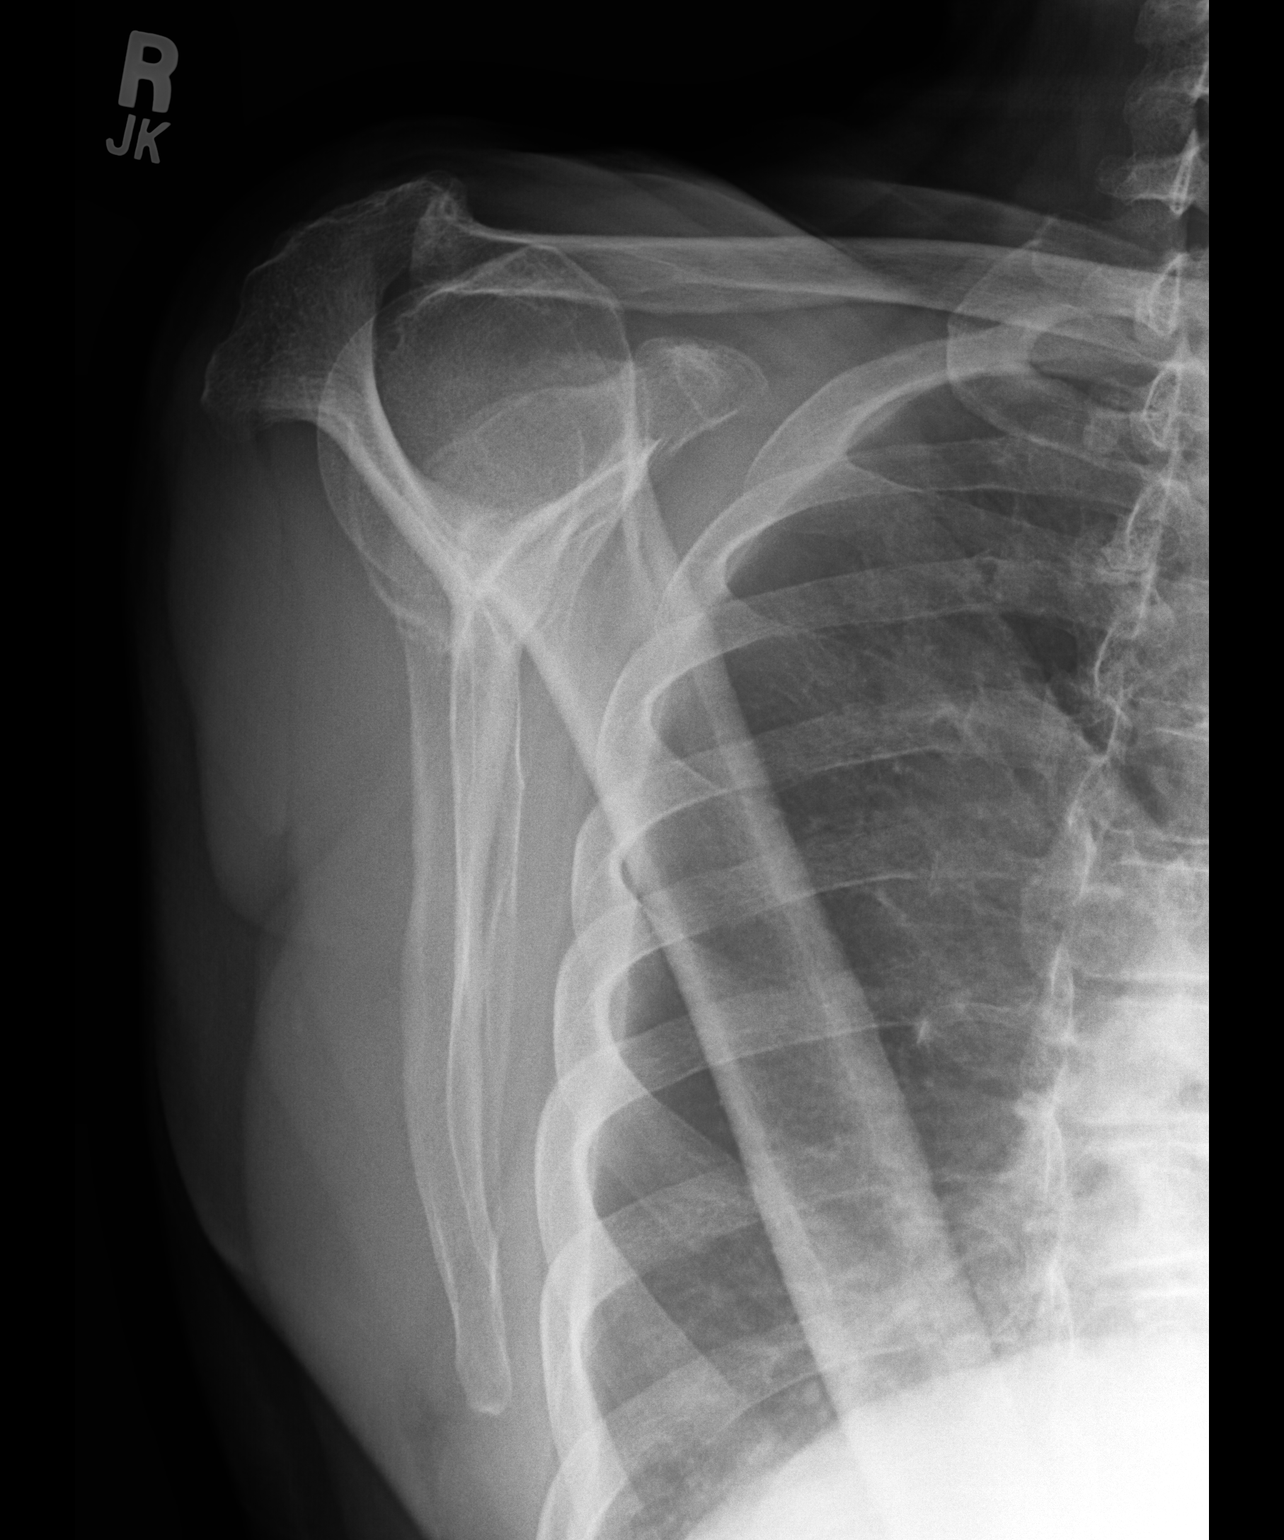

[3 of 3 positions shown; findings below may reference images not displayed]

FINDINGS: For age there is mild to moderate degenerative joint disease of the
right shoulder with some loss of joint space and subchondral cyst
formation. No acute abnormality is seen. There is mild degenerative
change of the right AC joint as well.
IMPRESSION: Mild to moderate degenerative joint disease of the right shoulder
for age. No acute abnormality.

## 2019-12-10 ENCOUNTER — Emergency Department
Admission: EM | Admit: 2019-12-10 | Discharge: 2019-12-10 | Disposition: A | Discharge status: Home / Self Care | Payer: Medicare Other | Source: Home / Self Care | Attending: Family Medicine | Admitting: Family Medicine

## 2019-12-10 ENCOUNTER — Other Ambulatory Visit: Payer: Self-pay

## 2019-12-10 DIAGNOSIS — K1379 Other lesions of oral mucosa: Secondary | ICD-10-CM

## 2019-12-10 DIAGNOSIS — L570 Actinic keratosis: Secondary | ICD-10-CM

## 2019-12-10 NOTE — Discharge Instructions (Addendum)
[facility] also has an office in [place], [place]: [place] [phone]

## 2019-12-10 NOTE — ED Provider Notes (Signed)
[facility]    CSN: [id number] Arrival date & time: 12/10/19  0903      History   Chief Complaint Chief Complaint  Patient presents with  . Skin Problem    HPI Jack Howard is a 84 y.o. male.   Patient complains of a small pruritic bump on his left cheek present for at least a week.  He also has a small pruritic area on his right cheek.  The history is provided by the patient.    Past Medical History:  Diagnosis Date  . Anxiety   . Dementia ([place]) 07/04/2016   new diagnosis of early stages of alzheimers dementia 3 weeks ago   . GERD (gastroesophageal reflux disease)   . Insomnia   . Left rotator cuff tear 05/21/2011  . Malignant melanoma ([place])   . Prostate cancer [facility])     Patient Active Problem List   Diagnosis Date Noted  . Left rotator cuff tear 05/21/2011  . UNSPECIFIED SUDDEN HEARING LOSS 01/06/2011  . PROSTATE CANCER 04/15/2008  . HYPOGLYCEMIA 04/15/2008  . ANXIETY 04/15/2008  . BRADYCARDIA 04/15/2008  . Esophageal reflux 04/15/2008  . INSOMNIA 04/15/2008  . Upper airway cough syndrome 04/15/2008  . CHEST PAIN, RECURRENT 04/15/2008  . MALIGNANT MELANOMA, HX OF 04/15/2008  . BENIGN PROSTATIC HYPERTROPHY, HX OF 04/15/2008    Past Surgical History:  Procedure Laterality Date  . EXTRACORPOREAL SHOCK WAVE LITHOTRIPSY Right 07/27/2018   Procedure: EXTRACORPOREAL SHOCK WAVE LITHOTRIPSY (ESWL);  Surgeon: Irine Seal, MD;  Location[facility];  Service: Urology;  Laterality: Right;  . EXTRACORPOREAL SHOCK WAVE LITHOTRIPSY Right 08/24/2018   Procedure: EXTRACORPOREAL SHOCK WAVE LITHOTRIPSY (ESWL);  Surgeon: Lucas Mallow, MD;  Location: [facility];  Service: Urology;  Laterality: Right;  . EYE SURGERY     both cataracts  . HEMORRHOID SURGERY    . KNEE SURGERY    . PROSTATE SURGERY    . ROTATOR CUFF REPAIR  05/21/2011  . TONSILLECTOMY         Home Medications    Prior to Admission medications   Medication Sig Start Date End Date Taking?  Authorizing Provider  citalopram (CELEXA) 20 MG tablet Take 20 mg by mouth daily.      [provider]  RABEprazole (ACIPHEX) 20 MG tablet Take 30- 60 min before your first and last meals of the day 09/24/18   Tanda Rockers, MD  zolpidem (AMBIEN) 5 MG tablet Take 5 mg by mouth at bedtime as needed for sleep.    [provider]    Family History Family History  Problem Relation Age of Onset  . Breast cancer Mother   . Prostate cancer Father   . Leukemia Brother     Social History Social History   Tobacco Use  . Smoking status: Never Smoker  . Smokeless tobacco: Never Used  Vaping Use  . Vaping Use: Never used  Substance Use Topics  . Alcohol use: No  . Drug use: No     Allergies   Patient has no known allergies.   Review of Systems Review of Systems  Constitutional: Negative for activity change, appetite change, chills, diaphoresis, fatigue and fever.  HENT: Negative.   Eyes: Negative.   Respiratory: Negative.   Cardiovascular: Negative.   Gastrointestinal: Negative.   Genitourinary: Negative.   Musculoskeletal: Negative.   Skin: Positive for color change and rash.  Neurological: Negative.      Physical Exam Triage Vital Signs ED Triage Vitals  Enc Vitals Group     BP 12/10/19 0932 (!) 162/81     Pulse Rate 12/10/19 0932 61     Resp 12/10/19 0932 16     Temp 12/10/19 0932 97.7 F (36.5 C)     Temp Source 12/10/19 0932 Oral     SpO2 12/10/19 0932 99 %     Weight --      Height --      Head Circumference --      Peak Flow --      Pain Score 12/10/19 0929 0     Pain Loc --      Pain Edu? --      Excl. in [place]? --    No data found.  Updated Vital Signs BP (!) 162/81 (BP Location: Right Arm)   Pulse 61   Temp 97.7 F (36.5 C) (Oral)   Resp 16   SpO2 99%   Visual Acuity Right Eye Distance:   Left Eye Distance:   Bilateral Distance:    Right Eye Near:   Left Eye Near:    Bilateral Near:     Physical Exam Vitals and  nursing note reviewed.  Constitutional:      General: He is not in acute distress. HENT:     Head: Normocephalic.      Comments: Left cheek has a 26mm diameter dome shaped nodule as noted on diagram.   Right cheek has a 4-40mm diameter slightly raised non-pigmented lesion suggestive of actinic keratosis.    Right Ear: External ear normal.     Left Ear: External ear normal.     Nose: Nose normal.     Mouth/Throat:     Pharynx: Oropharynx is clear.  Cardiovascular:     Rate and Rhythm: Normal rate.  Pulmonary:     Effort: Pulmonary effort is normal.  Lymphadenopathy:     Cervical: No cervical adenopathy.  Skin:    General: Skin is warm and dry.  Neurological:     Mental Status: He is alert.      UC Treatments / Results  Labs (all labs ordered are listed, but only abnormal results are displayed) Labs Reviewed - No data to display  EKG   Radiology No results found.  Procedures Procedures (including critical care time)  Medications Ordered in UC Medications - No data to display  Initial Impression / Assessment and Plan / UC Course  I have reviewed the triage vital signs and the nursing notes.  Pertinent labs & imaging results that were available during my care of the patient were reviewed by me and considered in my medical decision making (see chart for details).    Nodule left face appears to a basal cell CA.  Recommend evaluation/treatment by a Psychiatric nurse.   Final Clinical Impressions(s) / UC Diagnoses   Final diagnoses:  Nodule of cheek  Keratosis, actinic     Discharge Instructions     [facility] also has an office in [place], [place]: [place] [phone]    ED Prescriptions    None        Kandra Nicolas, MD 12/13/19 1151

## 2019-12-10 NOTE — ED Triage Notes (Signed)
Patient presents to Urgent Care with complaints of small itchy bumps on his cheeks since about a week ago. Patient reports his eyes have also been itchy for several months.

## 2020-07-29 IMAGING — DX DG CHEST 2V
2 series · 2 of 2 positions shown · non-contrast
Comparison: 05/27/2015

CLINICAL DATA: Cough, congestion

EXAM:
CHEST - 2 VIEW

[chest pa]
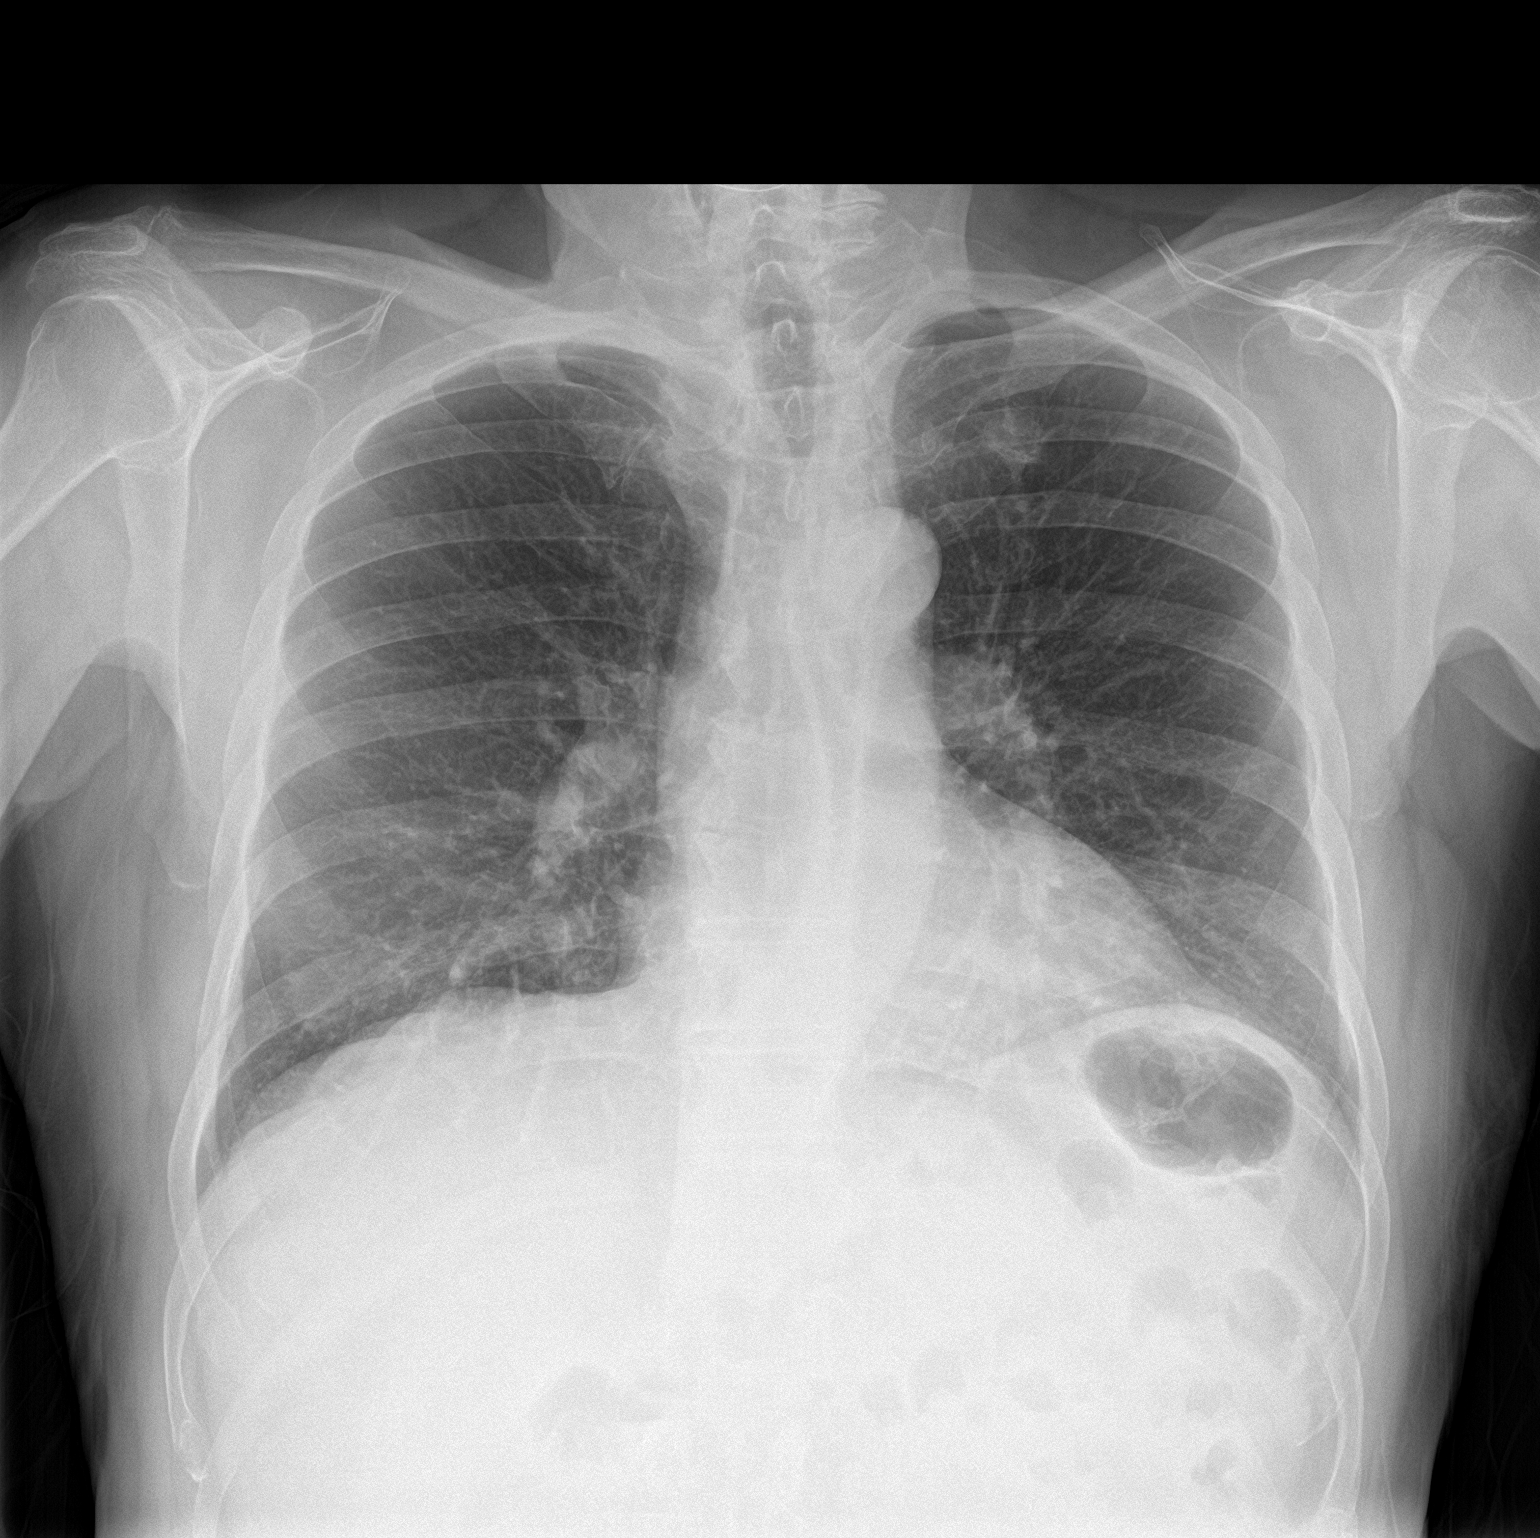

[chest lat]
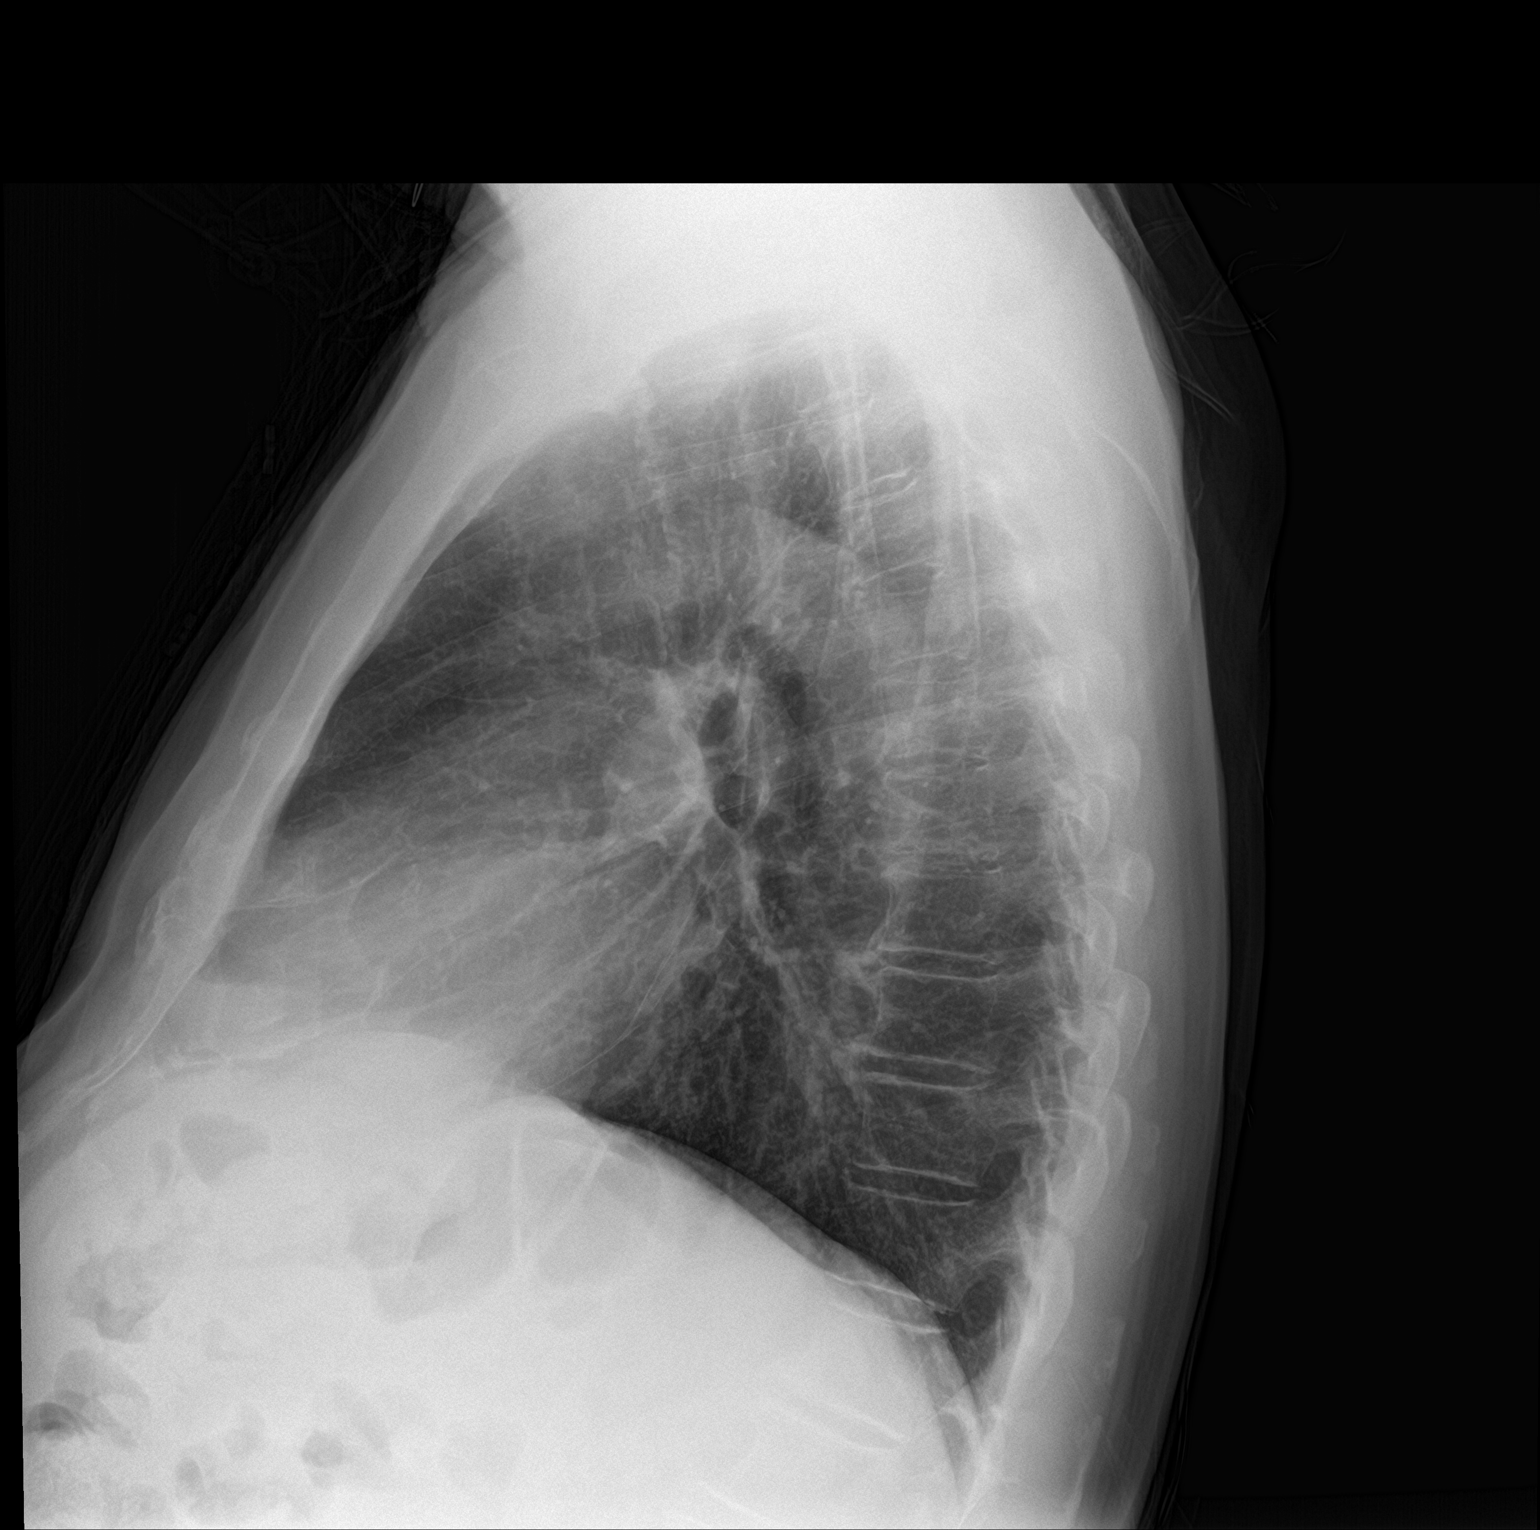

[2 of 2 positions shown; findings below may reference images not displayed]

FINDINGS: Heart and mediastinal contours are within normal limits. No focal
opacities or effusions. No acute bony abnormality.
IMPRESSION: No active cardiopulmonary disease.

## 2020-10-13 ENCOUNTER — Emergency Department (INDEPENDENT_AMBULATORY_CARE_PROVIDER_SITE_OTHER)
Admission: EM | Admit: 2020-10-13 | Discharge: 2020-10-13 | Disposition: A | Discharge status: Home / Self Care | Payer: Medicare Other | Source: Home / Self Care | Attending: Family Medicine | Admitting: Family Medicine

## 2020-10-13 ENCOUNTER — Other Ambulatory Visit: Payer: Self-pay

## 2020-10-13 ENCOUNTER — Emergency Department (INDEPENDENT_AMBULATORY_CARE_PROVIDER_SITE_OTHER): Payer: Medicare Other

## 2020-10-13 ENCOUNTER — Encounter: Payer: Self-pay | Admitting: Emergency Medicine

## 2020-10-13 DIAGNOSIS — R109 Unspecified abdominal pain: Secondary | ICD-10-CM | POA: Diagnosis not present

## 2020-10-13 DIAGNOSIS — K5732 Diverticulitis of large intestine without perforation or abscess without bleeding: Secondary | ICD-10-CM | POA: Diagnosis not present

## 2020-10-13 DIAGNOSIS — R1032 Left lower quadrant pain: Secondary | ICD-10-CM

## 2020-10-13 MED ORDER — KETOROLAC TROMETHAMINE 30 MG/ML IJ SOLN
30.0000 mg | Freq: Once | INTRAMUSCULAR | Status: AC
  Administered 2020-10-13: 30 mg via INTRAMUSCULAR

## 2020-10-13 MED ORDER — AMOXICILLIN-POT CLAVULANATE 875-125 MG PO TABS: ORAL_TABLET | ORAL | 0 refills | Status: AC

## 2020-10-13 MED ORDER — ONDANSETRON 4 MG PO TBDP
4.0000 mg | ORAL_TABLET | Freq: Once | ORAL | Status: AC
  Administered 2020-10-13: 4 mg via ORAL

## 2020-10-13 MED ORDER — HYDROCODONE-ACETAMINOPHEN 5-325 MG PO TABS: 1.0000 | ORAL_TABLET | Freq: Four times a day (QID) | ORAL | 0 refills | Status: AC | PRN

## 2020-10-13 NOTE — ED Provider Notes (Signed)
[facility]    CSN: [id number] Arrival date & time: 10/13/20  1430      History   Chief Complaint Chief Complaint  Patient presents with   Groin Pain   Abdominal Pain    LLQ    HPI Jack Howard is a 85 y.o. male.   About two hours ago while sitting, patient suddenly developed severe sharp pain in his left groin and left testicle that has persisted.  He recalls no injury.  He denies urinary symptoms although he is presently unable to urinate (last voided at 0730).  He has a past history of kidney stone but states that his present pain is different.  He denies nausea/vomiting, changes in bowel movements, and fevers, chills, and sweats.  The history is provided by the patient.  Groin Pain This is a new problem. Episode onset: 2 hours ago. The problem occurs constantly. The problem has not changed since onset.Associated symptoms include abdominal pain. The symptoms are aggravated by walking, exertion and bending. Nothing relieves the symptoms. He has tried nothing for the symptoms.  Abdominal Pain Associated symptoms: nausea   Associated symptoms: no chills, no constipation, no diarrhea, no fatigue, no fever, no hematuria and no vomiting    Past Medical History:  Diagnosis Date   Anxiety    Dementia ([place]) 07/04/2016   new diagnosis of early stages of alzheimers dementia 3 weeks ago    GERD (gastroesophageal reflux disease)    Insomnia    Left rotator cuff tear 05/21/2011   Malignant melanoma ([place])    Prostate cancer [facility])     Patient Active Problem List   Diagnosis Date Noted   Left rotator cuff tear 05/21/2011   UNSPECIFIED SUDDEN HEARING LOSS 01/06/2011   PROSTATE CANCER 04/15/2008   HYPOGLYCEMIA 04/15/2008   ANXIETY 04/15/2008   BRADYCARDIA 04/15/2008   Esophageal reflux 04/15/2008   INSOMNIA 04/15/2008   Upper airway cough syndrome 04/15/2008   CHEST PAIN, RECURRENT 04/15/2008   MALIGNANT MELANOMA, HX OF 04/15/2008   BENIGN PROSTATIC  HYPERTROPHY, HX OF 04/15/2008    Past Surgical History:  Procedure Laterality Date   EXTRACORPOREAL SHOCK WAVE LITHOTRIPSY Right 07/27/2018   Procedure: EXTRACORPOREAL SHOCK WAVE LITHOTRIPSY (ESWL);  Surgeon: Irine Seal, MD;  Location: [facility];  Service: Urology;  Laterality: Right;   EXTRACORPOREAL SHOCK WAVE LITHOTRIPSY Right 08/24/2018   Procedure: EXTRACORPOREAL SHOCK WAVE LITHOTRIPSY (ESWL);  Surgeon: Lucas Mallow, MD;  Location: [facility];  Service: Urology;  Laterality: Right;   EYE SURGERY     both cataracts   HEMORRHOID SURGERY     KNEE SURGERY     PROSTATE SURGERY     ROTATOR CUFF REPAIR  05/21/2011   TONSILLECTOMY         Home Medications    Prior to Admission medications   Medication Sig Start Date End Date Taking? Authorizing Provider  amoxicillin-clavulanate (AUGMENTIN) 875-125 MG tablet Take one tab PO Q8hr. 10/13/20  Yes Emilina Smarr, Ishmael Holter, MD  citalopram (CELEXA) 20 MG tablet Take 20 mg by mouth daily.     Yes [provider]  HYDROcodone-acetaminophen (NORCO/VICODIN) 5-325 MG tablet Take 1 tablet by mouth every 6 (six) hours as needed for moderate pain or severe pain. 10/13/20  Yes Kandra Nicolas, MD  zolpidem (AMBIEN) 5 MG tablet Take 5 mg by mouth at bedtime as needed for sleep.   Yes [provider]  omeprazole (PRILOSEC) 20 MG capsule Take by mouth.    [provider]  RABEprazole (ACIPHEX) 20 MG tablet Take 30- 60 min before your first and last meals of the day Patient not taking: Reported on 10/13/2020 09/24/18   Tanda Rockers, MD    Family History Family History  Problem Relation Age of Onset   Breast cancer Mother    Prostate cancer Father    Leukemia Brother     Social History Social History   Tobacco Use   Smoking status: Never   Smokeless tobacco: Never  Vaping Use   Vaping Use: Never used  Substance Use Topics   Alcohol use: No   Drug use: No     Allergies   Patient has no known allergies.   Review of  Systems Review of Systems  Constitutional:  Positive for activity change. Negative for appetite change, chills, diaphoresis, fatigue and fever.  HENT: Negative.    Eyes: Negative.   Respiratory: Negative.    Cardiovascular: Negative.   Gastrointestinal:  Positive for abdominal pain and nausea. Negative for abdominal distention, blood in stool, constipation, diarrhea and vomiting.  Genitourinary:  Positive for decreased urine volume, difficulty urinating and testicular pain. Negative for flank pain, frequency, hematuria, penile pain and urgency.  Musculoskeletal: Negative.   Skin: Negative.   Neurological: Negative.     Physical Exam Triage Vital Signs ED Triage Vitals  Enc Vitals Group     BP 10/13/20 1443 (!) 177/74     Pulse Rate 10/13/20 1443 60     Resp 10/13/20 1443 (!) 24     Temp 10/13/20 1443 99.2 F (37.3 C)     Temp Source 10/13/20 1443 Oral     SpO2 10/13/20 1443 100 %     Weight 10/13/20 1446 152 lb (68.9 kg)     Height 10/13/20 1446 5' 7.5" (1.715 m)     Head Circumference --      Peak Flow --      Pain Score 10/13/20 1445 10     Pain Loc --      Pain Edu? --      Excl. in [place]? --    No data found.  Updated Vital Signs BP (!) 177/74 (BP Location: Right Arm)   Pulse 60   Temp 99.2 F (37.3 C) (Oral)   Resp (!) 24   Ht 5' 7.5" (1.715 m)   Wt 68.9 kg   SpO2 100%   BMI 23.46 kg/m   Visual Acuity Right Eye Distance:   Left Eye Distance:   Bilateral Distance:    Right Eye Near:   Left Eye Near:    Bilateral Near:     Physical Exam Vitals and nursing note reviewed.  Constitutional:      General: He is in acute distress.     Appearance: He is not ill-appearing.  HENT:     Head: Normocephalic.     Right Ear: External ear normal.     Left Ear: External ear normal.     Nose: Nose normal.  Eyes:     Extraocular Movements: Extraocular movements intact.     Pupils: Pupils are equal, round, and reactive to light.  Cardiovascular:     Rate and  Rhythm: Normal rate and regular rhythm.     Heart sounds: Normal heart sounds.  Pulmonary:     Breath sounds: Normal breath sounds.  Abdominal:     General: Abdomen is flat. Bowel sounds are normal. There is no distension.     Palpations: Abdomen is soft. There is no mass.  Tenderness: There is abdominal tenderness. There is no right CVA tenderness, left CVA tenderness, guarding or rebound.     Hernia: No hernia is present. There is no hernia in the left inguinal area or right inguinal area.  Genitourinary:    Penis: Normal. No discharge.      Testes: Normal.     Epididymis:     Right: Normal.     Left: Normal.       Comments: There is distinct tenderness to palpation in the left inguinal canal but no hernia palpated. Tenderness extends to the left lower abdomen.  No swelling or tenderness to palpation of the testicles. Musculoskeletal:     Cervical back: Neck supple.     Right lower leg: No edema.     Left lower leg: No edema.  Lymphadenopathy:     Cervical: No cervical adenopathy.     Lower Body: No right inguinal adenopathy. No left inguinal adenopathy.  Skin:    General: Skin is warm and dry.     Findings: No rash.  Neurological:     Mental Status: He is alert and oriented to person, place, and time.     UC Treatments / Results  Labs (all labs ordered are listed, but only abnormal results are displayed) Labs Reviewed  CBC WITH DIFFERENTIAL/PLATELET    EKG   Radiology CT Renal Stone Study  Result Date: 10/13/2020 CLINICAL DATA:  Sudden onset of groin pain 2 hours ago. EXAM: CT ABDOMEN AND PELVIS WITHOUT CONTRAST TECHNIQUE: Multidetector CT imaging of the abdomen and pelvis was performed following the standard protocol without IV contrast. COMPARISON:  07/25/2016 FINDINGS: Lower chest: Stable small to moderate-sized hiatal hernia. Borderline enlarged heart. Stable small right lower lobe nodule measuring 5 mm in diameter on image number 4/4. This is also stable since  07/18/2012 and, therefore, considered benign and not needing follow-up. Hepatobiliary: The previously demonstrated diffuse low density of the liver is not visualized today. Unremarkable gallbladder. Pancreas: Unremarkable. No pancreatic ductal dilatation or surrounding inflammatory changes. Spleen: Normal in size without focal abnormality. Adrenals/Urinary Tract: Normal appearing adrenal glands. The previously demonstrated small lower pole right renal calculus is no longer seen. The kidneys, ureters and urinary bladder are unremarkable. Stomach/Bowel: Multiple sigmoid colon diverticula. Interval fluid density in 1 of the diverticula with mild surrounding soft tissue stranding and interval diffuse low density wall thickening and mild mucosal enhancement involving the adjacent proximal sigmoid colon. Small to moderate-sized hiatal hernia. Unremarkable small bowel and appendix. Vascular/Lymphatic: Minimal atheromatous arterial calcifications. No enlarged lymph nodes. Reproductive: Mild-to-moderate heterogeneous enlargement of the prostate gland with coarse calcifications. Other: Moderate-sized bilateral inguinal hernias containing fat. Small umbilical hernia containing fat. Musculoskeletal: Lumbar and lower thoracic spine degenerative changes and mild scoliosis. IMPRESSION: 1. Acute proximal sigmoid colon diverticulitis without abscess. 2. Sigmoid diverticulosis. 3. Resolved hepatic steatosis. 4. The previously demonstrated right renal calculus no longer seen. Electronically Signed   By: Claudie Revering M.D.   On: 10/13/2020 15:51    Procedures Procedures (including critical care time)  Medications Ordered in UC Medications  ketorolac (TORADOL) 30 MG/ML injection 30 mg (30 mg Intramuscular Given 10/13/20 1513)  ondansetron (ZOFRAN-ODT) disintegrating tablet 4 mg (4 mg Oral Given 10/13/20 1525)    Initial Impression / Assessment and Plan / UC Course  I have reviewed the triage vital signs and the nursing  notes.  Pertinent labs & imaging results that were available during my care of the patient were reviewed by me and considered in my  medical decision making (see chart for details).    POCT urinalysis shows trace-intact blood, otherwise negative Administered Toradol 30mg  IM.  Administered Zofran ODT 4mg  PO. CBC pending. Begin Augmentin.  Rx for Vicodin (#10, no refill). Controlled Substance Prescriptions I have consulted the [place] Controlled Substances Registry for this patient, and feel the risk/benefit ratio today is favorable for proceeding with this prescription for a controlled substance.   Followup with Family Doctor in 3 days.  Final Clinical Impressions(s) / UC Diagnoses   Final diagnoses:  Left groin pain  Sigmoid diverticulitis     Discharge Instructions      Begin clear liquids for about 18 to 24 hours, then may begin a BRAT diet (Bananas, Rice, Applesauce, Toast) when abdominal pain has improved. Then gradually advance to a regular diet as tolerated.    If symptoms become significantly worse during the night or over the weekend, proceed to the local emergency room.      ED Prescriptions     Medication Sig Dispense Auth. Provider   amoxicillin-clavulanate (AUGMENTIN) 875-125 MG tablet Take one tab PO Q8hr. 21 tablet Kandra Nicolas, MD   HYDROcodone-acetaminophen (NORCO/VICODIN) 5-325 MG tablet Take 1 tablet by mouth every 6 (six) hours as needed for moderate pain or severe pain. 10 tablet Kandra Nicolas, MD         Kandra Nicolas, MD 10/13/20 [phone]

## 2020-10-13 NOTE — ED Triage Notes (Signed)
LLQ pain started 2 hours ago  Unable to void Remote Hx of kidney stone  Last voided at 0730

## 2020-10-13 NOTE — Discharge Instructions (Signed)
Begin clear liquids for about 18 to 24 hours, then may begin a BRAT diet (Bananas, Rice, Applesauce, Toast) when abdominal pain has improved. Then gradually advance to a regular diet as tolerated.    If symptoms become significantly worse during the night or over the weekend, proceed to the local emergency room.

## 2020-10-14 LAB — CBC WITH DIFFERENTIAL/PLATELET
Absolute Monocytes: 979 cells/uL — ABNORMAL HIGH (ref 200–950)
Basophils Absolute: 31 cells/uL (ref 0–200)
Basophils Relative: 0.3 %
Eosinophils Absolute: 62 cells/uL (ref 15–500)
Eosinophils Relative: 0.6 %
HCT: 36.5 % — ABNORMAL LOW (ref 38.5–50.0)
Hemoglobin: 12 g/dL — ABNORMAL LOW (ref 13.2–17.1)
Lymphs Abs: 2204 cells/uL (ref 850–3900)
MCH: 29.3 pg (ref 27.0–33.0)
MCHC: 32.9 g/dL (ref 32.0–36.0)
MCV: 89.2 fL (ref 80.0–100.0)
MPV: 10.8 fL (ref 7.5–12.5)
Monocytes Relative: 9.5 %
Neutro Abs: 7025 cells/uL (ref 1500–7800)
Neutrophils Relative %: 68.2 %
Platelets: 216 10*3/uL (ref 140–400)
RBC: 4.09 10*6/uL — ABNORMAL LOW (ref 4.20–5.80)
RDW: 13.3 % (ref 11.0–15.0)
Total Lymphocyte: 21.4 %
WBC: 10.3 10*3/uL (ref 3.8–10.8)

## 2022-10-29 NOTE — Progress Notes (Signed)
 Patient ID:  Jack Howard is a 87 y.o. (DOB [birth year]) male.   Assessment   1. Onychomycosis   2. Ingrown nail of great toe of left foot     Plan  I did review the nature of the patient's condition with the patient in great length. The primary encounter diagnosis was Onychomycosis. A diagnosis of Ingrown nail of great toe of left foot was also pertinent to this visit.  I did discuss the consequences of the patient's conditions began to worsen.  I did discuss the pathology, anatomy and biomechanics of his conditions with him in detail.  The patient's treatment plan was formulated on the basis of current conditions, overall medical condition, activity and social issues.  The treatment options were discussed.  Risk of complications from treatment is low.  I discussed with the patient several different treatment options for the onychomycotic nails that are present to bilateral feet.  I recommended topical prescription, oral antifungal treatment as well as permanent nail removal if there are ingrowing symptoms.  I explained each of these in great detail.  Patient has elected to undergo topical treatment.  A prescription for the below medication has been sent to the pharmacy.  The patient does understand the risks and benefits of the medication and if any adverse reactions do occur, then the medication will be discontinued and the office will be contacted.  Health maintenance issues including healthy diet and exercise were discussed with the patient.  The risk, benefits, and alternatives of the medications and treatment plan prescribed today were discussed, and patient expressed understanding and agreement with the plan.   No orders of the defined types were placed in this encounter.  No follow-ups on file.   Patient's Medications       * Accurate as of October 29, 2022 12:55 PM. Reflects encounter med changes as of last refresh          New Prescriptions      Instructions   Misc. Devices Misc Started by: Abby CHRISTELLA Poli, DPM  Ibuprofen 3%, terbinafine 3%, fluconazole 2%, DMSO 50%, 15 mL. Please apply to nailbed twice daily.       Continued Medications      Instructions  atorvastatin 40 mg tablet Commonly known as: LIPITOR  40 mg, Oral, Daily   azelastine 0.1 % nasal spray Commonly known as: ASTELIN  Squirt 2 sprays in both nostrils twice daily.   citalopram  hydrobromide 20 mg tablet Commonly known as: CELEXA   1 tablet, Oral, Daily   esomeprazole magnesium 40 mg capsule Commonly known as: NEXIUM  TAKE ONE CAPSULE (40 MG DOSE) BY MOUTH 2 (TWO) TIMES A DAY WITH MEALS.   famotidine 40 MG tablet Commonly known as: PEPCID  40 mg, Oral, At bedtime   ferrous sulfate 325 (65 FE) MG tablet  325 mg, Oral, 2 times daily with meals   HYDROcodone -homatropine 5-1.5 mg/5 mL solution Commonly known as: HYCODAN,HYDROMET  5 mLs, Oral, 2 times a day as needed   lisinopril 2.5 mg tablet Commonly known as: PRINIVIL,ZESTRIL  2.5 mg, Oral, Daily   metoprolol succinate 25 mg 24 hr tablet Commonly known as: TOPROL-XL  25 mg, Oral, Daily   mirabegron 50 mg 24 hr tablet Commonly known as: MYRBETRIQ  50 mg, Oral, Daily   mirtazapine 15 mg tablet Commonly known as: REMERON  15 mg, Oral, At bedtime   predniSONE  20 mg tablet Commonly known as: DELTASONE   Starting Thursday, 06/20/2022, take 3 tablets once  daily x 1 day then take 2 tablets once daily x 4 additional days.  Take with food.   promethazine -dextromethorphan 6.25-15 MG/5ML syrup Commonly known as: PROMETHAZINE -DM  Take 10 mL every 8 hours up to 3 times daily as needed for cough.         Allergies Allergies  Allergen Reactions  . Baclofen Other    Tremors   . Protonix  Unknown    Subjective    HPI:  Jack Howard is a 87 y.o. male who is new to the office today and presents for evaluation of left 1st great toenail pain. The patient indicates that the condition has been present  for 6 months. The patient describes the condition as dull. He rates his pain as mild and moderate. The patient indicates that the condition is aggravated by touch. The patient indicates the symptoms have been treated with none. Patient is under the care of Asberry Gals, DO  Objective   Physical Exam:   Constitutional: The patient is alert and oriented x 3, well-nourished and well developed and in no apparent distress.  Vascular: The dorsalis pedis and posterior tibial pulses are palpable bilaterally.  There is good perfusion to the digital level with a capillary refill time of less than 3 seconds to the digits, bilaterally.  There is normal proximal to distal cooling of both lower extremities.  There is no appreciable edema noted to both lower extremities.  Neurological: Epicritic sensation is grossly intact to both lower extremities.   Dermatological: Inspection and palpation of the skin and subcutaneous tissues reveals normal temperature, texture and turgor of both lower extremities.  The nails of both feet do not reveal any signs of drainage.  There are no open wounds noted to both lower extremities.  Left great toenail is thickened, dystrophic, elongated, discolored and slightly ingrowing on the lateral border.  Musculoskeletal: There is good ankle joint dorsiflexion with the knee extended and the knee flexed bilaterally.  No pain or crepitus are noted with range of motion of the subtalar and the ankle joints bilaterally.  There is good stability noted of the ankle, subtalar, mid-tarsal and metatarsophalangeal joints of both lower extremities.  There are no signs of effusions or masses are noted bilaterally.  All muscle groups have normal strength, tone, stability and range of motion.  A normal base and angle of gait are noted bilaterally.  Review of Systems General ROS: negative Opthalmic ROS: negative ENT ROS: negative Cardiovascular ROS: negative Respiratory ROS:  negative Gastrointestinal ROS: negative Genito-Urinary ROS: negative Musculoskeletal ROS: negative Dermatological ROS: negative Neurological ROS: negative Pyschological ROS: negative Endocrine ROS: negative Hematological & Lymphatic ROS: negative Allergy  & Immunology ROS: negative  Ht 5' 6 (1.676 m)   Wt 152 lb (68.9 kg)   BMI 24.53 kg/m    Patient Active Problem List   Diagnosis Date Noted  . 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 Chronic right-sided low back pain without sciatica 07/18/2022  . Vitamin D insufficiency 05/03/2022  . Orthostatic dizziness 11/22/2021  . Celiac artery stenosis (*) 09/12/2021    Formatting of this note might be different from the original.  04/14/21 Cardiology H&P Formatting of this note might be different from the original.  04/14/21 Cardiology H&P   . Coronary artery disease involving native coronary artery of native heart without angina pectoris 06/06/2021  . Prediabetes 06/06/2021  . Pure hypercholesterolemia 06/06/2021  . Chronic kidney disease (CKD) stage G3a/A3, moderately decreased glomerular filtration rate (GFR) between  45-59 mL/min/1.73 square meter and albuminuria creatinine ratio greater than 300 mg/g (*) 12/01/2020  . Chronic cough 08/27/2019  . Late onset Alzheimer's disease without behavioral disturbance (*) 07/04/2016  . OAB (overactive bladder) 11/25/2013  . Loss of hearing 09/10/2013  . Sleep apnea 09/10/2013  . Malignant neoplasm of skin 09/10/2013  . Inguinal hernia 09/10/2013  . History of melanoma 02/11/2013    back   . Hypoglycemia 02/11/2013  . GERD (gastroesophageal reflux disease) 08/10/2012  . PUD (peptic ulcer disease) 08/10/2012  . Personal history of prostate cancer 08/10/2012  . Personal history of colonic polyps 08/10/2012    10/1 IMO update   . Personal history of other specified diseases(V13.89) 04/15/2008    Overview:  Qualifier: Diagnosis of  By: Winona Raring     . Other specified cardiac dysrhythmias(427.89) 04/15/2008    Overview:  Qualifier: Diagnosis of  By: Winona Raring    . Malignant neoplasm of prostate (*) 04/15/2008    Overview:  Qualifier: Diagnosis of  By: Winona Raring    . Anxiety state, unspecified 04/15/2008    Overview:  Qualifier: Diagnosis of  By: Winona Raring  IMO 10/01 Updates   . Upper airway cough syndrome 04/15/2008    Formatting of this note might be different from the original.  Onset age [age] with severe gerd/ worse x 2018 (about the time he got a dog)  - DgEs 05/14/17 Incomplete primary esophageal peristalsis was observed. There is relative thickening of the mucosal folds in the distal esophagus suggesting esophagitis. No esophageal mass or stricture. No spontaneous gastroesophageal reflux was observed   during the exam. Moderate sized fixed hiatal hernia.  - Allergy  profile 09/24/2018 >  Eos 0.4 /  IgE  7 RAST neg   - max gerd rx / bed blocks rec 09/24/2018         Last Assessment & Plan:   Formatting of this note might be different from the original.  Onset age [age] with severe gerd/ worse x 2018 (about the time he got a dog)  - DgEs 05/14/17 Incomplete primary esophageal peristalsis was observed. There is relative thickening of the mucosal folds in the distal esophagus suggesting esophagitis. No esophageal mass or stricture. No spontaneous gastroesophageal reflux was observed   during the exam. Moderate sized fixed hiatal hernia.  - Allergy  profile 09/24/2018 >  Eos 0.4 /  IgE  pending  - max gerd rx / bed blocks rec 09/24/2018     The most common causes of chronic cough in immunocompetent adults include the following: upper airway cough syndrome (UACS), previously referred to as postnasal drip syndrome (PNDS), which is caused by variety of rhinosinus conditions; (2) asthma; (3) GERD; (4) chronic bronchitis from cigarette smoking or other inhaled environmental irritants; (5) nonasthmatic eosinophilic  bronchitis; and (6) bronchiectasis.     These conditions, singly or in combination, have accounted for up to 94% of the causes of chronic cough in prospective studies.     Other conditions have constituted no >6% of the causes in prospective studies These have included bronchogenic carcinoma, chronic interstitial pneumonia, sarcoidosis, left ventricular failure, ACEI-induced cough, and aspiration from a condition associated with pharyngeal dysfunction.      Chronic cough is often simultaneously caused by more than one condition. A single cause has been found from 38 to 82% of the time, multiple causes from 18 to 62%. Multiply caused cough has been the result of three diseases up to 42% of the time.  Most likely this is Upper airway cough syndrome (previously labeled PNDS),  is so named because it's frequently impossible to sort out how much is  CR/sinusitis with freq throat clearing (which can be related to primary GERD)   vs  causing  secondary ( extra esophageal)  GERD from wide swings in gastric pressure that occur with throat clearing, often  promoting self use of mint and menthol  lozenges that reduce the lower esophageal sphincter tone and exacerbate the problem further in a cyclical fashion.     These are the same pts (now being labeled as having irritable larynx syndrome by some cough centers) who not infrequently have a history of having failed to tolerate ace inhibitors,  dry powder inhalers or biphosphonates or report having atypical/extraesophageal reflux symptoms that don't respond to standard doses of PPI  and are easily confused as having aecopd or asthma flares by even experienced allergists/ pulmonologists (myself included).     Of the three most common causes of  Sub-acute / recurrent or chronic cough, only one (GERD)  can actually contribute to/ trigger  the other two (asthma and post nasal drip syndrome)  and perpetuate the cylce of cough.    While not intuitively obvious,  many patients with chronic low grade reflux do not cough until there is a primary insult that disturbs the protective epithelial barrier and exposes sensitive nerve endings.   This is typically viral but can due to PNDS and  Latter  may apply here (? From allergic rhinitis)     >>>  The point is that once this occurs, it is difficult to eliminate the cycle  using anything but a maximally effective acid suppression regimen at least in the short run, accompanied by an appropriate diet to address non acid GERD  And then consider adding 1st gen H1 blockers per guidelines  If tolerated pending f/u allergy  testing above     Advised:   The standardized cough guidelines published in Chest by Charlie Coder in 2006 are still the best available and consist of a multiple step process (up to 12!) , not a single office visit,  and are intended  to address this problem logically,  with an alogrithm dependent on response to empiric treatment at  each progressive step  to determine a specific diagnosis with  minimal addtional testing needed. Therefore if adherence is an issue or can't be accurately verified,  it's very unlikely the standard evaluation and treatment will be successful here.      Furthermore, response to therapy (other than acute cough suppression, which should only be used short term with avoidance of narcotic containing cough syrups if possible), can be a gradual process for which the patient is not likely to  perceive immediate benefit.    Unlike going to an eye doctor where the best perscription is almost always the first one and is immediately effective, this is almost never the case in the management of chronic cough syndromes. Therefore the patient needs to commit up front to consistently adhere to recommendations  for up to 6 weeks of therapy directed at the likely underlying problem(s) before the response can be reasonably evaluated.     >>>> f/u 6 weeks with all meds in hand using a trust but  verify approach to confirm accurate Medication  Reconciliation The principal here is that until we are certain that the  patients are doing what we've asked, it makes no sense to ask them to do more.  Total time devoted to counseling  > 50 % of initial 60 min office visit:  review case with pt/ discussion of options/alternatives/ personally creating written customized instructions  in presence of pt  then going over those specific  Instructions directly with the pt including how to use all of the meds but in particular covering each new medication in detail and the difference between the maintenance= automatic meds and the prns using an action plan format for the latter (If this problem/symptom => do that organization reading Left to right).  Please see AVS from this visit for a full list of these instructions which I personally wrote for this pt and  are unique to this visit.     The patient's past surgical history, past medical history, social history, family history, immunization history and health maintenance information were reviewed and are documented in the patient's electronic health record.  Patient Care Team: Asberry Gals, DO as PCP - General (Internal Medicine) Elsie FORBES Skates, MD as Consulting Physician (Gastroenterology) Coleen LELON Pae, PA-C as Physician Assistant (Gastroenterology)   I dictated a portion of the note using voice recognition software, along with smart phrases and written information directly from the patient that have been entered by myself or by my assistant or verbal information during the face-to-face time entered by my assistant and minor syntax, contextual, and spelling errors may be related to the use of this software and were not intentional. I have reviewed the information contained in this note and personally verified its accuracy.  I obtained the history of present illness and personally performed the physical exam. A written report was generated and  sent electronically or by fax to the consulting physician.  *Some images could not be shown.

## 2023-03-20 NOTE — Progress Notes (Signed)
 Patient ID:  Jack Howard is a 87 y.o. (DOB July 20, 1935) male.   Assessment   1. Onychomycosis   2. Ingrown nail of great toe of left foot     Plan  I did review the nature of the patient's condition with the patient in great detail. The primary encounter diagnosis was Onychomycosis. A diagnosis of Ingrown nail of great toe of left foot was also pertinent to this visit.  I did discuss the consequences of the patient's conditions began to worsen.  I did discuss the pathology, anatomy and biomechanics of his conditions with him in detail.  The patient's treatment plan was formulated on the basis of current conditions, overall medical condition, activity and social issues.  The treatment options were discussed.  Risk of complications from treatment is low.  Recommend the patient continue with the topical treatment for at least 3 more months.  If it is unsuccessful we will discuss oral treatment or permanently removing the nail.  Patient will follow-up in 3 months.  Health maintenance issues including healthy diet and exercise were discussed with the patient.  Risks, benefits, and alternatives of the medications and treatment plan prescribed today were discussed, and patient expressed understanding and agreement with the plan.   No orders of the defined types were placed in this encounter.  Follow up in about 3 months (around 06/20/2023).   Patient's Medications       * Accurate as of March 20, 2023 10:53 AM. Reflects encounter med changes as of last refresh          Continued Medications      Instructions  atorvastatin 40 mg tablet Commonly known as: LIPITOR  40 mg, Oral, Daily   * buprenorphine 5 mcg/hr Commonly known as: BUTRANS  1 patch, Transdermal, Weekly at 0900   * buprenorphine 5 mcg/hr Commonly known as: BUTRANS Start taking on: March 27, 2023  1 patch, Transdermal, Weekly at 0900   esomeprazole magnesium 40 mg capsule Commonly known as:  NEXIUM  TAKE ONE CAPSULE (40 MG DOSE) BY MOUTH 2 (TWO) TIMES A DAY WITH MEALS.   famotidine 40 mg tablet Commonly known as: PEPCID  40 mg, Oral, At bedtime   mirtazapine 15 mg tablet Commonly known as: REMERON  15 mg, Oral, At bedtime   Misc. Devices Misc  Ibuprofen 3%, terbinafine 3%, fluconazole 2%, DMSO 50%, 15 mL. Please apply to nailbed twice daily.   naloxone nasal spray (TAKE HOME PACK) Commonly known as: NARCAN  1 spray, Intranasal, Once as needed      * * This list has 2 medication(s) that are the same as other medications prescribed for you. Read the directions carefully, and ask your doctor or other care provider to review them with you.            Allergies Allergies  Allergen Reactions  . Baclofen Other    Tremors   . Protonix  Unknown    Subjective   HPI Jack Howard is a 87 y.o. male who presents in follow up for evaluation of onychomycosis. The patient indicates that the condition has been present for several months. The patient describes the condition as uncomfortable. He is currently in no pain. The patient indicates that the condition is aggravated by nothing. The condition has not interfered with the patient's daily routine. The condition has not limited the ability to exercise. The patient indicates the symptoms have been treated with routine toenail care, provided by the office. Patient  is under the care of Asberry Gals, DO  Objective   Physical Exam:   Constitutional: The patient is alert and oriented x 3, well-nourished and well developed and in no apparent distress.  Vascular: The dorsalis pedis and posterior tibial pulses are palpable bilaterally.  There is good perfusion to the digital level with a capillary refill time of less than 3 seconds bilaterally.  There is normal proximal to distal cooling of both lower extremities.  No appreciable edema is noted to both lower extremities.  Neurological: Epicritic sensation is grossly  intact to both lower extremities.    Dermatological: Inspection and palpation of the skin and subcutaneous tissues reveals normal temperature, texture and turgor of both lower extremities.  The nails of both feet do not reveal any signs of drainage.  There are no open wounds noted to both lower extremities.  Nails thickened, dystrophic, elongated and discolored as well as ingrowing on the lateral border of the left hallux toenail.  Musculoskeletal: There is good ankle joint dorsiflexion with the knee extended and the knee flexed bilaterally.  No pain or crepitus are noted with range of motion of the subtalar and the ankle joints bilaterally.  There is good stability noted of the ankle, subtalar, mid-tarsal and metatarsophalangeal joints of both lower extremities.  No signs of effusions or masses are noted bilaterally.  There is normal muscle strength, tone, stability and range of motion of all muscle groups.  A normal base and angle of gait are noted bilaterally.    Review of Systems: Review of systems is complete and negative except as noted in History of Present Illness.  Ht 5' 7 (1.702 m)   Wt 153 lb (69.4 kg)   BMI 23.96 kg/m    Patient Active Problem List   Diagnosis Date Noted  . 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 Chronic right-sided low back pain without sciatica 07/18/2022  . Vitamin D insufficiency 05/03/2022  . Orthostatic dizziness 11/22/2021  . Celiac artery stenosis (*) 09/12/2021    Formatting of this note might be different from the original.  04/14/21 Cardiology H&P Formatting of this note might be different from the original.  04/14/21 Cardiology H&P   . Coronary artery disease involving native coronary artery of native heart without angina pectoris 06/06/2021  . Prediabetes 06/06/2021  . Pure hypercholesterolemia 06/06/2021  . Chronic kidney disease (CKD) stage G3a/A3, moderately decreased glomerular filtration rate (GFR) between  45-59 mL/min/1.73 square meter and albuminuria creatinine ratio greater than 300 mg/g (*) 12/01/2020  . Chronic cough 08/27/2019  . Late onset Alzheimer's disease without behavioral disturbance (*) 07/04/2016  . OAB (overactive bladder) 11/25/2013  . Loss of hearing 09/10/2013  . Sleep apnea 09/10/2013  . Malignant neoplasm of skin 09/10/2013  . Inguinal hernia 09/10/2013  . History of melanoma 02/11/2013    back   . Hypoglycemia 02/11/2013  . GERD (gastroesophageal reflux disease) 08/10/2012  . PUD (peptic ulcer disease) 08/10/2012  . Personal history of prostate cancer 08/10/2012  . Personal history of colonic polyps 08/10/2012    10/1 IMO update   . Personal history of other specified diseases(V13.89) 04/15/2008    Overview:  Qualifier: Diagnosis of  By: Winona Raring    . Other specified cardiac dysrhythmias(427.89) 04/15/2008    Overview:  Qualifier: Diagnosis of  By: Winona Raring    . Malignant neoplasm of prostate (*) 04/15/2008    Overview:  Qualifier: Diagnosis of  By: Winona Raring    .  Anxiety state, unspecified 04/15/2008    Overview:  Qualifier: Diagnosis of  By: Winona Raring  IMO 10/01 Updates   . Upper airway cough syndrome 04/15/2008    Formatting of this note might be different from the original.  Onset age [age] with severe gerd/ worse x 2018 (about the time he got a dog)  - DgEs 05/14/17 Incomplete primary esophageal peristalsis was observed. There is relative thickening of the mucosal folds in the distal esophagus suggesting esophagitis. No esophageal mass or stricture. No spontaneous gastroesophageal reflux was observed   during the exam. Moderate sized fixed hiatal hernia.  - Allergy  profile 09/24/2018 >  Eos 0.4 /  IgE  7 RAST neg   - max gerd rx / bed blocks rec 09/24/2018         Last Assessment & Plan:   Formatting of this note might be different from the original.  Onset age [age] with severe gerd/ worse x 2018 (about the time he  got a dog)  - DgEs 05/14/17 Incomplete primary esophageal peristalsis was observed. There is relative thickening of the mucosal folds in the distal esophagus suggesting esophagitis. No esophageal mass or stricture. No spontaneous gastroesophageal reflux was observed   during the exam. Moderate sized fixed hiatal hernia.  - Allergy  profile 09/24/2018 >  Eos 0.4 /  IgE  pending  - max gerd rx / bed blocks rec 09/24/2018     The most common causes of chronic cough in immunocompetent adults include the following: upper airway cough syndrome (UACS), previously referred to as postnasal drip syndrome (PNDS), which is caused by variety of rhinosinus conditions; (2) asthma; (3) GERD; (4) chronic bronchitis from cigarette smoking or other inhaled environmental irritants; (5) nonasthmatic eosinophilic bronchitis; and (6) bronchiectasis.     These conditions, singly or in combination, have accounted for up to 94% of the causes of chronic cough in prospective studies.     Other conditions have constituted no >6% of the causes in prospective studies These have included bronchogenic carcinoma, chronic interstitial pneumonia, sarcoidosis, left ventricular failure, ACEI-induced cough, and aspiration from a condition associated with pharyngeal dysfunction.      Chronic cough is often simultaneously caused by more than one condition. A single cause has been found from 38 to 82% of the time, multiple causes from 18 to 62%. Multiply caused cough has been the result of three diseases up to 42% of the time.       Most likely this is Upper airway cough syndrome (previously labeled PNDS),  is so named because it's frequently impossible to sort out how much is  CR/sinusitis with freq throat clearing (which can be related to primary GERD)   vs  causing  secondary ( extra esophageal)  GERD from wide swings in gastric pressure that occur with throat clearing, often  promoting self use of mint and menthol  lozenges that reduce the  lower esophageal sphincter tone and exacerbate the problem further in a cyclical fashion.     These are the same pts (now being labeled as having irritable larynx syndrome by some cough centers) who not infrequently have a history of having failed to tolerate ace inhibitors,  dry powder inhalers or biphosphonates or report having atypical/extraesophageal reflux symptoms that don't respond to standard doses of PPI  and are easily confused as having aecopd or asthma flares by even experienced allergists/ pulmonologists (myself included).     Of the three most common causes of  Sub-acute / recurrent or  chronic cough, only one (GERD)  can actually contribute to/ trigger  the other two (asthma and post nasal drip syndrome)  and perpetuate the cylce of cough.    While not intuitively obvious, many patients with chronic low grade reflux do not cough until there is a primary insult that disturbs the protective epithelial barrier and exposes sensitive nerve endings.   This is typically viral but can due to PNDS and  Latter  may apply here (? From allergic rhinitis)     >>>  The point is that once this occurs, it is difficult to eliminate the cycle  using anything but a maximally effective acid suppression regimen at least in the short run, accompanied by an appropriate diet to address non acid GERD  And then consider adding 1st gen H1 blockers per guidelines  If tolerated pending f/u allergy  testing above     Advised:   The standardized cough guidelines published in Chest by Charlie Coder in 2006 are still the best available and consist of a multiple step process (up to 12!) , not a single office visit,  and are intended  to address this problem logically,  with an alogrithm dependent on response to empiric treatment at  each progressive step  to determine a specific diagnosis with  minimal addtional testing needed. Therefore if adherence is an issue or can't be accurately verified,  it's very unlikely the  standard evaluation and treatment will be successful here.      Furthermore, response to therapy (other than acute cough suppression, which should only be used short term with avoidance of narcotic containing cough syrups if possible), can be a gradual process for which the patient is not likely to  perceive immediate benefit.    Unlike going to an eye doctor where the best perscription is almost always the first one and is immediately effective, this is almost never the case in the management of chronic cough syndromes. Therefore the patient needs to commit up front to consistently adhere to recommendations  for up to 6 weeks of therapy directed at the likely underlying problem(s) before the response can be reasonably evaluated.     >>>> f/u 6 weeks with all meds in hand using a trust but verify approach to confirm accurate Medication  Reconciliation The principal here is that until we are certain that the  patients are doing what we've asked, it makes no sense to ask them to do more.     Total time devoted to counseling  > 50 % of initial 60 min office visit:  review case with pt/ discussion of options/alternatives/ personally creating written customized instructions  in presence of pt  then going over those specific  Instructions directly with the pt including how to use all of the meds but in particular covering each new medication in detail and the difference between the maintenance= automatic meds and the prns using an action plan format for the latter (If this problem/symptom => do that organization reading Left to right).  Please see AVS from this visit for a full list of these instructions which I personally wrote for this pt and  are unique to this visit.     The patient's past surgical history, past medical history, social history, family history, immunization history and health maintenance information were reviewed and are documented in the patient's electronic health record.  Patient  Care Team: Asberry Gals, DO as PCP - General (Internal Medicine) Elsie FORBES Skates, MD as Consulting Physician (Gastroenterology)  Coleen LELON Pae, PA-C as Physician Assistant (Gastroenterology)   I dictated a portion of the note using voice recognition software, along with smart phrases and written information directly from the patient that have been entered by myself or by my assistant or verbal information during the face-to-face time entered by my assistant and minor syntax, contextual, and spelling errors may be related to the use of this software and were not intentional. I have reviewed the information contained in this note and personally verified its accuracy.  I obtained the history of present illness and personally performed the physical exam. A written report was generated and sent electronically or by fax to the consulting physician.  *Some images could not be shown.

## 2024-03-18 ENCOUNTER — Ambulatory Visit: Admitting: Podiatry

## 2024-03-18 ENCOUNTER — Encounter: Payer: Self-pay | Admitting: Podiatry

## 2024-03-18 DIAGNOSIS — L603 Nail dystrophy: Secondary | ICD-10-CM | POA: Diagnosis not present

## 2024-03-18 DIAGNOSIS — L600 Ingrowing nail: Secondary | ICD-10-CM | POA: Diagnosis not present

## 2024-03-18 NOTE — Progress Notes (Signed)
  Subjective:  Patient ID: Jack Howard, male    DOB: 11-16-1935,   MRN: [id number]  Chief Complaint  Patient presents with   Nail Problem    This left big toenail hurts.    88 y.o. male presents for concern of left great big toenail pain.  He relates he has been dealing with with this for years.  He has had partial ingrown toenail removal in the past.  He has also been treated for fungal nails in the past and has had no improvement.  He relates the toenail does give him significant amounts of pain.. Denies any other pedal complaints. Denies n/v/f/c.   Past Medical History:  Diagnosis Date   Anxiety    Dementia (HCC) 07/04/2016   new diagnosis of early stages of alzheimers dementia 3 weeks ago    GERD (gastroesophageal reflux disease)    Insomnia    Left rotator cuff tear 05/21/2011   Malignant melanoma (HCC)    Prostate cancer (HCC)     Objective:  Physical Exam: Vascular: DP/PT pulses 2/4 bilateral. CFT <3 seconds. Normal hair growth on digits. No edema.  Skin. No lacerations or abrasions bilateral feet.  Left hallux nail thickened dystrophic and incurvated bilateral borders with tenderness to palpation.  No erythema edema or purulence noted. Musculoskeletal: MMT 5/5 bilateral lower extremities in DF, PF, Inversion and Eversion. Deceased ROM in DF of ankle joint.  Neurological: Sensation intact to light touch.   Assessment:   1. Ingrown left greater toenail   2. Onychodystrophy      Plan:  Patient was evaluated and treated and all questions answered. Discussed ingrown toenails etiology and treatment options including procedure for removal vs conservative care.  Patient requesting removal of ingrown nail today. Procedure below.  Discussed procedure and post procedure care and patient expressed understanding.  Will follow-up in 2 weeks for nail check or sooner if any problems arise.    Procedure:  Procedure: Total Nail Avulsion of left hallux nail Surgeon: Asberry Failing, DPM  Pre-op Dx: Ingrown toenail without infection Post-op: Same  Place of Surgery: Office exam room.  Indications for surgery: Painful and ingrown toenail.    The patient is requesting removal of nail with chemical matrixectomy. Risks and complications were discussed with the patient for which they understand and written consent was obtained. Under sterile conditions a total of 3 mL of  1% lidocaine  plain was infiltrated in a hallux block fashion. Once anesthetized, the skin was prepped in sterile fashion. A tourniquet was then applied. Next the entire left hallux nail was removed.  Next phenol was then applied under standard conditions to permanently destroy the matrix and copiously irrigated. Silvadene was applied. A dry sterile dressing was applied. After application of the dressing the tourniquet was removed and there is found to be an immediate capillary refill time to the digit. The patient tolerated the procedure well without any complications. Post procedure instructions were discussed the patient for which he verbally understood. Follow-up in two weeks for nail check or sooner if any problems are to arise. Discussed signs/symptoms of infection and directed to call the office immediately should any occur or go directly to the emergency room. In the meantime, encouraged to call the office with any questions, concerns, changes symptoms.   Asberry Failing, DPM

## 2024-03-18 NOTE — Patient Instructions (Signed)

## 2024-03-19 ENCOUNTER — Ambulatory Visit: Admitting: Podiatry
